# Patient Record
Sex: Male | Born: 1985 | Race: White | Hispanic: No | Marital: Single | Smoking: Former smoker
Health system: Southern US, Community
[De-identification: ages and names within clinical notes are randomized; demographics above are authoritative.]

## PROBLEM LIST (undated history)

## (undated) DIAGNOSIS — I10 Essential (primary) hypertension: Secondary | ICD-10-CM

## (undated) DIAGNOSIS — F319 Bipolar disorder, unspecified: Secondary | ICD-10-CM

---

## 2006-12-27 ENCOUNTER — Emergency Department (HOSPITAL_COMMUNITY): Admission: EM | Admit: 2006-12-27 | Discharge: 2006-12-28 | Payer: Self-pay | Admitting: Emergency Medicine

## 2016-06-10 ENCOUNTER — Emergency Department
Admission: EM | Admit: 2016-06-10 | Discharge: 2016-06-10 | Disposition: A | Discharge status: Home / Self Care | Payer: No Typology Code available for payment source | Attending: Emergency Medicine | Admitting: Emergency Medicine

## 2016-06-10 ENCOUNTER — Emergency Department: Payer: No Typology Code available for payment source

## 2016-06-10 ENCOUNTER — Encounter: Payer: Self-pay | Admitting: Emergency Medicine

## 2016-06-10 DIAGNOSIS — S301XXA Contusion of abdominal wall, initial encounter: Secondary | ICD-10-CM | POA: Insufficient documentation

## 2016-06-10 DIAGNOSIS — R51 Headache: Secondary | ICD-10-CM | POA: Insufficient documentation

## 2016-06-10 DIAGNOSIS — S80211A Abrasion, right knee, initial encounter: Secondary | ICD-10-CM | POA: Diagnosis not present

## 2016-06-10 DIAGNOSIS — Y939 Activity, unspecified: Secondary | ICD-10-CM | POA: Insufficient documentation

## 2016-06-10 DIAGNOSIS — T07XXXA Unspecified multiple injuries, initial encounter: Secondary | ICD-10-CM

## 2016-06-10 DIAGNOSIS — S199XXA Unspecified injury of neck, initial encounter: Secondary | ICD-10-CM | POA: Diagnosis present

## 2016-06-10 DIAGNOSIS — S161XXA Strain of muscle, fascia and tendon at neck level, initial encounter: Secondary | ICD-10-CM | POA: Diagnosis not present

## 2016-06-10 DIAGNOSIS — S80212A Abrasion, left knee, initial encounter: Secondary | ICD-10-CM | POA: Insufficient documentation

## 2016-06-10 DIAGNOSIS — Y999 Unspecified external cause status: Secondary | ICD-10-CM | POA: Insufficient documentation

## 2016-06-10 DIAGNOSIS — F17210 Nicotine dependence, cigarettes, uncomplicated: Secondary | ICD-10-CM | POA: Diagnosis not present

## 2016-06-10 DIAGNOSIS — Y92410 Unspecified street and highway as the place of occurrence of the external cause: Secondary | ICD-10-CM | POA: Insufficient documentation

## 2016-06-10 HISTORY — DX: Bipolar disorder, unspecified: F31.9

## 2016-06-10 LAB — URINALYSIS, COMPLETE (UACMP) WITH MICROSCOPIC
BILIRUBIN URINE: NEGATIVE
Bacteria, UA: NONE SEEN
GLUCOSE, UA: NEGATIVE mg/dL
HGB URINE DIPSTICK: NEGATIVE
KETONES UR: NEGATIVE mg/dL
LEUKOCYTES UA: NEGATIVE
Nitrite: NEGATIVE
PH: 7 (ref 5.0–8.0)
Protein, ur: 30 mg/dL — AB
Specific Gravity, Urine: 1.02 (ref 1.005–1.030)
Squamous Epithelial / LPF: NONE SEEN

## 2016-06-10 LAB — COMPREHENSIVE METABOLIC PANEL
ALBUMIN: 4.2 g/dL (ref 3.5–5.0)
ALK PHOS: 47 U/L (ref 38–126)
ALT: 31 U/L (ref 17–63)
AST: 21 U/L (ref 15–41)
Anion gap: 7 (ref 5–15)
BILIRUBIN TOTAL: 0.7 mg/dL (ref 0.3–1.2)
BUN: 23 mg/dL — AB (ref 6–20)
CO2: 25 mmol/L (ref 22–32)
Calcium: 9.1 mg/dL (ref 8.9–10.3)
Chloride: 105 mmol/L (ref 101–111)
Creatinine, Ser: 1.06 mg/dL (ref 0.61–1.24)
GFR calc Af Amer: >60 mL/min (ref >60)
GFR calc non Af Amer: >60 mL/min (ref >60)
GLUCOSE: 108 mg/dL — AB (ref 65–99)
POTASSIUM: 4 mmol/L (ref 3.5–5.1)
Sodium: 137 mmol/L (ref 135–145)
TOTAL PROTEIN: 7 g/dL (ref 6.5–8.1)

## 2016-06-10 LAB — LIPASE, BLOOD: Lipase: 31 U/L (ref 11–51)

## 2016-06-10 LAB — CBC WITH DIFFERENTIAL/PLATELET
BASOS ABS: 0.1 10*3/uL (ref 0–0.1)
BASOS PCT: 1 %
Eosinophils Absolute: 0 10*3/uL (ref 0–0.7)
Eosinophils Relative: 1 %
HEMATOCRIT: 48 % (ref 40.0–52.0)
HEMOGLOBIN: 16.1 g/dL (ref 13.0–18.0)
Lymphocytes Relative: 14 %
Lymphs Abs: 1.2 10*3/uL (ref 1.0–3.6)
MCH: 29.7 pg (ref 26.0–34.0)
MCHC: 33.5 g/dL (ref 32.0–36.0)
MCV: 88.5 fL (ref 80.0–100.0)
Monocytes Absolute: 0.7 10*3/uL (ref 0.2–1.0)
Monocytes Relative: 8 %
NEUTROS ABS: 6.6 10*3/uL — AB (ref 1.4–6.5)
NEUTROS PCT: 76 %
Platelets: 258 10*3/uL (ref 150–440)
RBC: 5.42 MIL/uL (ref 4.40–5.90)
RDW: 13.5 % (ref 11.5–14.5)
WBC: 8.5 10*3/uL (ref 3.8–10.6)

## 2016-06-10 MED ORDER — IOPAMIDOL (ISOVUE-300) INJECTION 61%
100.0000 mL | Freq: Once | INTRAVENOUS | Status: AC | PRN
  Administered 2016-06-10: 100 mL via INTRAVENOUS

## 2016-06-10 MED ORDER — SODIUM CHLORIDE 0.9 % IV BOLUS (SEPSIS)
1000.0000 mL | Freq: Once | INTRAVENOUS | Status: AC
  Administered 2016-06-10: 1000 mL via INTRAVENOUS

## 2016-06-10 NOTE — ED Notes (Signed)
C-collar in place. Pt lying flat on stretcher. Side rails up. Pt watching tv. Alert and oriented.
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
## 2016-06-10 NOTE — ED Notes (Signed)
Esignature pad not working in room. Pt verbalized understanding of DC papers and ambulated to lobby
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
## 2016-06-10 NOTE — ED Notes (Signed)
C-collar removed by Dr. Pershing ProudSchaevitz. Pt given urinal to attempt to collect urine sample.

## 2016-06-10 NOTE — ED Notes (Signed)
Highway patrol at bed side.  
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
## 2019-01-11 ENCOUNTER — Other Ambulatory Visit: Payer: Self-pay | Admitting: Surgery

## 2019-01-11 DIAGNOSIS — S46911A Strain of unspecified muscle, fascia and tendon at shoulder and upper arm level, right arm, initial encounter: Secondary | ICD-10-CM

## 2019-01-11 DIAGNOSIS — M7581 Other shoulder lesions, right shoulder: Secondary | ICD-10-CM | POA: Insufficient documentation

## 2019-03-09 ENCOUNTER — Other Ambulatory Visit: Payer: Self-pay | Admitting: Surgery

## 2019-03-09 DIAGNOSIS — M7581 Other shoulder lesions, right shoulder: Secondary | ICD-10-CM

## 2019-03-09 DIAGNOSIS — S46911D Strain of unspecified muscle, fascia and tendon at shoulder and upper arm level, right arm, subsequent encounter: Secondary | ICD-10-CM

## 2021-09-05 ENCOUNTER — Ambulatory Visit
Admission: RE | Admit: 2021-09-05 | Discharge: 2021-09-05 | Disposition: A | Discharge status: Home / Self Care | Payer: 59 | Source: Ambulatory Visit | Attending: Family Medicine | Admitting: Family Medicine

## 2021-09-05 ENCOUNTER — Ambulatory Visit (INDEPENDENT_AMBULATORY_CARE_PROVIDER_SITE_OTHER): Payer: 59

## 2021-09-05 VITALS — BP 146/99 | HR 115 | Temp 98.4°F | Resp 16

## 2021-09-05 DIAGNOSIS — M25562 Pain in left knee: Secondary | ICD-10-CM

## 2021-09-05 DIAGNOSIS — G8929 Other chronic pain: Secondary | ICD-10-CM

## 2021-09-05 MED ORDER — PREDNISONE 20 MG PO TABS: 40.0000 mg | ORAL_TABLET | Freq: Every day | ORAL | 0 refills | Status: DC
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
## 2021-09-05 NOTE — ED Provider Notes (Signed)
[facility]   [id number] 09/05/21 Arrival Time: 1109  ASSESSMENT & PLAN:  1. Chronic pain of left knee    I have personally viewed the imaging studies ordered this visit. No bony abnormalities on LEFT knee films today. Discussed.  Trial of: Discharge Medication List as of 09/05/2021 12:01 PM     START taking these medications   Details  predniSONE (DELTASONE) 20 MG tablet Take 2 tablets (40 mg total) by mouth daily., Starting Wed 09/05/2021, Normal       Work note provided; 72 hours.  Orders Placed This Encounter  Procedures   DG Knee Complete 4 Views Left    Recommend:  Follow-up Information     Schedule an appointment as soon as possible for a visit  with Ollen Gross, MD.   Specialty: Orthopedic Surgery Contact information: [address] [place] [place] [address] [phone]                Reviewed expectations re: course of current medical issues. Questions answered. Outlined signs and symptoms indicating need for more acute intervention. Patient verbalized understanding. After Visit Summary given.  SUBJECTIVE: History from: patient. Nathaniel Medina is a 36 y.o. male who reports fairly persistent mild to moderate pain of his left knee; thinks mostly anterior; described as dull; without radiation. First noted approx 4-5 mo ago. No specific trauma at that time. Worse after prolonged weight bearing. Associated symptoms: none reported. Extremity sensation changes or weakness: none. Occas ibuprofen without much help. No specific locking up or giving out of knee.  History reviewed. No pertinent surgical history.   OBJECTIVE:  Vitals:   09/05/21 1123  BP: (!) 146/99  Pulse: (!) 115  Resp: 16  Temp: 98.4 F (36.9 C)  TempSrc: Oral  SpO2: 95%    General appearance: alert; no distress HEENT: Staves; AT Neck: supple with FROM Resp: unlabored respirations Extremities: LLE: warm with well perfused appearance; no specific bony TTP  over left knee; without gross deformities; swelling: none; bruising: none; knee ROM: normal CV: brisk extremity capillary refill of LLE; 2+ DP pulse of LLE. Skin: warm and dry; no visible rashes Neurologic: gait normal; normal sensation and strength of LLE Psychological: alert and cooperative; normal mood and affect  Imaging: DG Knee Complete 4 Views Left  Result Date: 09/05/2021 CLINICAL DATA:  Chronic knee pain. EXAM: LEFT KNEE - COMPLETE 4+ VIEW COMPARISON:  None Available. FINDINGS: The joint spaces are maintained. No acute bony findings or osteochondral abnormality. No chondrocalcinosis or joint effusion. IMPRESSION: Normal left knee radiographs. Electronically Signed   By: Rudie Meyer M.D.   On: 09/05/2021 11:56      Allergies  Allergen Reactions   Penicillins Hives    Past Medical History:  Diagnosis Date   Manic depression (HCC)    Social History   Socioeconomic History   Marital status: Single    Spouse name: Not on file   Number of children: Not on file   Years of education: Not on file   Highest education level: Not on file  Occupational History   Not on file  Tobacco Use   Smoking status: Every Day    Types: Cigars   Smokeless tobacco: Never   Tobacco comments:    6 cigars/day  Substance and Sexual Activity   Alcohol use: Yes    Comment: occasionally   Drug use: Not on file   Sexual activity: Not on file  Other Topics Concern   Not on file  Social  History Narrative   Not on file   Social Determinants of Health   Financial Resource Strain: Not on file  Food Insecurity: Not on file  Transportation Needs: Not on file  Physical Activity: Not on file  Stress: Not on file  Social Connections: Not on file   History reviewed. No pertinent family history. History reviewed. No pertinent surgical history.     Mardella Layman, MD 09/05/21 1334

## 2021-10-07 ENCOUNTER — Encounter: Payer: Self-pay | Admitting: Emergency Medicine

## 2021-10-07 ENCOUNTER — Telehealth: Payer: Self-pay | Admitting: Emergency Medicine

## 2021-10-07 ENCOUNTER — Ambulatory Visit
Admission: EM | Admit: 2021-10-07 | Discharge: 2021-10-07 | Disposition: A | Discharge status: Home / Self Care | Payer: 59 | Attending: Physician Assistant | Admitting: Physician Assistant

## 2021-10-07 DIAGNOSIS — J40 Bronchitis, not specified as acute or chronic: Secondary | ICD-10-CM

## 2021-10-07 DIAGNOSIS — J329 Chronic sinusitis, unspecified: Secondary | ICD-10-CM

## 2021-10-07 DIAGNOSIS — R030 Elevated blood-pressure reading, without diagnosis of hypertension: Secondary | ICD-10-CM | POA: Diagnosis not present

## 2021-10-07 MED ORDER — DOXYCYCLINE HYCLATE 100 MG PO CAPS: 100.0000 mg | ORAL_CAPSULE | Freq: Two times a day (BID) | ORAL | 0 refills | Status: DC

## 2021-10-07 MED ORDER — ALBUTEROL SULFATE HFA 108 (90 BASE) MCG/ACT IN AERS: 1.0000 | INHALATION_SPRAY | Freq: Four times a day (QID) | RESPIRATORY_TRACT | 0 refills | Status: DC | PRN

## 2021-10-07 NOTE — ED Provider Notes (Signed)
[facility]    CSN: [id number] Arrival date & time: 10/07/21  1330      History   Chief Complaint Chief Complaint  Patient presents with   Cough    HPI Nathaniel Medina is a 36 y.o. male.   Patient presents today with a 2-week history of URI symptoms.  Reports cough, nasal congestion, sinus pressure, fatigue, malaise.  Denies fever, chest pain, shortness of breath, nausea, vomiting, diarrhea.  He has taken over-the-counter medications including Mucinex, Robitussin, tea without improvement of symptoms.  Denies any known sick contacts.  He does have seasonal allergies but does not take medication to manage this regularly.  Denies history of asthma, COPD, current smoking.  He is a former smoker but quit 2 years ago.  Denies history of diabetes or immunosuppression.  Denies any recent antibiotic use.  He was given steroids for musculoskeletal pain 09/05/2021 but has not had any additional steroids since that time.  Blood pressure is elevated has been elevated for the last several visits.  He reports taking antihypertensive medications in the past but has not taken them recently.  He is not currently seeing his PCP regularly but will schedule an appointment.  Does report he has been using decongestants.  Also reports he is drinking more caffeine as he works third shift.  Denies any current chest pain, shortness of breath, headache, vision change, dizziness.    Past Medical History:  Diagnosis Date   Manic depression (HCC)     There are no problems to display for this patient.   History reviewed. No pertinent surgical history.     Home Medications    Prior to Admission medications   Medication Sig Start Date End Date Taking? Authorizing Provider  albuterol (VENTOLIN HFA) 108 (90 Base) MCG/ACT inhaler Inhale 1-2 puffs into the lungs every 6 (six) hours as needed for wheezing or shortness of breath. 10/07/21  Yes Tennyson Wacha K, PA-C  doxycycline (VIBRAMYCIN) 100 MG capsule  Take 1 capsule (100 mg total) by mouth 2 (two) times daily. 10/07/21  Yes Earlyne Feeser, Noberto Retort, PA-C    Family History History reviewed. No pertinent family history.  Social History Social History   Tobacco Use   Smoking status: Former    Types: Cigars   Smokeless tobacco: Never   Tobacco comments:    6 cigars/day  Vaping Use   Vaping Use: Never used  Substance Use Topics   Alcohol use: Yes    Comment: occasionally   Drug use: Never     Allergies   Penicillins   Review of Systems Review of Systems  Constitutional:  Positive for activity change. Negative for appetite change, fatigue and fever.  HENT:  Positive for congestion, sinus pressure and sore throat. Negative for sneezing.   Eyes:  Negative for visual disturbance.  Respiratory:  Positive for cough. Negative for shortness of breath.   Cardiovascular:  Negative for chest pain.  Gastrointestinal:  Negative for abdominal pain, diarrhea, nausea and vomiting.  Neurological:  Negative for dizziness, light-headedness and headaches.     Physical Exam Triage Vital Signs ED Triage Vitals  Enc Vitals Group     BP 10/07/21 1443 (!) 163/116     Pulse Rate 10/07/21 1443 83     Resp 10/07/21 1443 18     Temp 10/07/21 1443 98.9 F (37.2 C)     Temp Source 10/07/21 1443 Oral     SpO2 10/07/21 1443 93 %     Weight 10/07/21 1445 185  lb (83.9 kg)     Height 10/07/21 1445 5\' 11"  (1.803 m)     Head Circumference --      Peak Flow --      Pain Score 10/07/21 1445 0     Pain Loc --      Pain Edu? --      Excl. in [place]? --    No data found.  Updated Vital Signs BP (!) 163/116 (BP Location: Right Arm)   Pulse 83   Temp 98.9 F (37.2 C) (Oral)   Resp 18   Ht 5\' 11"  (1.803 m)   Wt 185 lb (83.9 kg)   SpO2 93%   BMI 25.80 kg/m   Visual Acuity Right Eye Distance:   Left Eye Distance:   Bilateral Distance:    Right Eye Near:   Left Eye Near:    Bilateral Near:     Physical Exam Vitals reviewed.  Constitutional:       General: He is awake.     Appearance: Normal appearance. He is well-developed. He is not ill-appearing.     Comments: Very pleasant male appears stated age in no acute distress sitting comfortably in exam room  HENT:     Head: Normocephalic and atraumatic.     Right Ear: Tympanic membrane, ear canal and external ear normal. Tympanic membrane is not erythematous or bulging.     Left Ear: Tympanic membrane, ear canal and external ear normal. Tympanic membrane is not erythematous or bulging.     Nose: Nose normal.     Mouth/Throat:     Pharynx: Uvula midline. No oropharyngeal exudate or posterior oropharyngeal erythema.  Cardiovascular:     Rate and Rhythm: Normal rate and regular rhythm.     Heart sounds: Normal heart sounds, S1 normal and S2 normal. No murmur heard. Pulmonary:     Effort: Pulmonary effort is normal. No accessory muscle usage or respiratory distress.     Breath sounds: Normal breath sounds. No stridor. No wheezing, rhonchi or rales.     Comments: Clear to auscultation bilaterally Neurological:     Mental Status: He is alert.  Psychiatric:        Behavior: Behavior is cooperative.      UC Treatments / Results  Labs (all labs ordered are listed, but only abnormal results are displayed) Labs Reviewed - No data to display  EKG   Radiology No results found.  Procedures Procedures (including critical care time)  Medications Ordered in UC Medications - No data to display  Initial Impression / Assessment and Plan / UC Course  I have reviewed the triage vital signs and the nursing notes.  Pertinent labs & imaging results that were available during my care of the patient were reviewed by me and considered in my medical decision making (see chart for details).     Patient is well-appearing, afebrile, nontoxic, nontachycardic.  No indication for viral testing as patient has been symptomatic for several weeks and this would not change management.  Given prolonged  and worsening symptoms concern for secondary bacterial infection.  We will treat with doxycycline given history of penicillin allergy.  He was instructed to avoid prolonged sun exposure with this medication.  Can use over-the-counter medications including Mucinex, Flonase, Tylenol.  He was given albuterol inhaler for shortness of breath and coughing fits.  Recommended he rest and drink plenty of fluid.  If his symptoms or not improving within a week he is to return for reevaluation.  If  anything worsens he needs to return immediately.  Blood pressure is very elevated.  Patient declined reinitiating antihypertensive medication today.  Denies any signs/symptoms of endorgan damage.  Recommended he avoid NSAIDs, caffeine, sodium, decongestants.  He is to follow-up closely with his PCP.  Discussed that if he develops any chest pain, shortness of breath, headache, vision change, dizziness in the setting of high blood pressure he is to go to the emergency room to which he expressed understanding.  Final Clinical Impressions(s) / UC Diagnoses   Final diagnoses:  Sinobronchitis  Elevated blood pressure reading     Discharge Instructions      I was that you have bronchitis.  Start doxycycline 100 mg twice daily for 10 days.  Use albuterol inhaler every 4-6 hours as needed for shortness of breath.  Continue your over-the-counter medications including Mucinex, Tylenol, Flonase.  Make sure you rest and drink plenty of fluid.  If your symptoms are not improving return for reevaluation.  Your blood pressure is very elevated.  Please follow-up with your primary care.  Avoid decongestants, caffeine, sodium, NSAIDs (aspirin, ibuprofen/Advil, naproxen/Aleve).  Monitor your blood pressure at home.  If you develop any chest pain, shortness of breath, headache, vision change, dizziness in the setting of high blood pressure you need to go to the emergency room.     ED Prescriptions     Medication Sig Dispense Auth.  Provider   doxycycline (VIBRAMYCIN) 100 MG capsule Take 1 capsule (100 mg total) by mouth 2 (two) times daily. 20 capsule Jasline Buskirk K, PA-C   albuterol (VENTOLIN HFA) 108 (90 Base) MCG/ACT inhaler Inhale 1-2 puffs into the lungs every 6 (six) hours as needed for wheezing or shortness of breath. 18 g Aailyah Dunbar K, PA-C      PDMP not reviewed this encounter.   Jeani Hawking, PA-C 10/07/21 1509

## 2021-10-07 NOTE — Discharge Instructions (Signed)
I was that you have bronchitis.  Start doxycycline 100 mg twice daily for 10 days.  Use albuterol inhaler every 4-6 hours as needed for shortness of breath.  Continue your over-the-counter medications including Mucinex, Tylenol, Flonase.  Make sure you rest and drink plenty of fluid.  If your symptoms are not improving return for reevaluation.  Your blood pressure is very elevated.  Please follow-up with your primary care.  Avoid decongestants, caffeine, sodium, NSAIDs (aspirin, ibuprofen/Advil, naproxen/Aleve).  Monitor your blood pressure at home.  If you develop any chest pain, shortness of breath, headache, vision change, dizziness in the setting of high blood pressure you need to go to the emergency room.

## 2021-10-07 NOTE — Telephone Encounter (Signed)
Pt called and asked if prescriptions could be sent to [facility] in [place]. Prescriptions electronically sent to [facility]. Pt aware.

## 2021-10-07 NOTE — ED Triage Notes (Signed)
Patient c/o productive cough x 2 weeks, congestion, nasal drainage.  Patient has been taken Mucinex, Robitussin, Tumeric tea.

## 2021-10-08 ENCOUNTER — Ambulatory Visit: Payer: Self-pay

## 2021-10-17 ENCOUNTER — Ambulatory Visit
Admission: RE | Admit: 2021-10-17 | Discharge: 2021-10-17 | Disposition: A | Discharge status: Home / Self Care | Payer: 59 | Source: Ambulatory Visit | Attending: Nurse Practitioner | Admitting: Nurse Practitioner

## 2021-10-17 ENCOUNTER — Other Ambulatory Visit: Payer: Self-pay

## 2021-10-17 ENCOUNTER — Ambulatory Visit (INDEPENDENT_AMBULATORY_CARE_PROVIDER_SITE_OTHER): Payer: 59

## 2021-10-17 VITALS — BP 144/101 | HR 96 | Temp 97.9°F | Resp 20

## 2021-10-17 DIAGNOSIS — R109 Unspecified abdominal pain: Secondary | ICD-10-CM

## 2021-10-17 DIAGNOSIS — R1033 Periumbilical pain: Secondary | ICD-10-CM | POA: Insufficient documentation

## 2021-10-17 HISTORY — DX: Essential (primary) hypertension: I10

## 2021-10-17 LAB — POCT URINALYSIS DIP (MANUAL ENTRY)
Bilirubin, UA: NEGATIVE
Blood, UA: NEGATIVE
Glucose, UA: NEGATIVE mg/dL
Ketones, POC UA: NEGATIVE mg/dL
Leukocytes, UA: NEGATIVE
Nitrite, UA: NEGATIVE
Protein Ur, POC: NEGATIVE mg/dL
Spec Grav, UA: 1.02 (ref 1.010–1.025)
Urobilinogen, UA: 0.2 E.U./dL
pH, UA: 7 (ref 5.0–8.0)

## 2021-10-17 NOTE — ED Provider Notes (Addendum)
[facility]    CSN: [id number] Arrival date & time: 10/17/21  1109      History   Chief Complaint Chief Complaint  Patient presents with   Abdominal Pain    HPI Nathaniel Medina is a 36 y.o. male.   The history is provided by the patient.   Patient presents for a several day history of lower abdominal pain.  Patient states that previously he had been diagnosed with bronchitis and had taken some time off from exercising.  He states that when he started exercising again he noticed subsequently thereafter he developed pain in his lower pelvic region.  Patient states that the pain feels more "sore" but he does have intermittent sharp pain.  He notices the pain more when he is up moving around versus when he is at rest.  He states that he has not had any fever, chills, nausea, vomiting, diarrhea, or abdominal tenderness.  He states that the pain worsens when he bends, twists, or turns.  States that he has tried Tylenol and ibuprofen for his symptoms. Past Medical History:  Diagnosis Date   Hypertension    Manic depression (HCC)     There are no problems to display for this patient.   History reviewed. No pertinent surgical history.     Home Medications    Prior to Admission medications   Medication Sig Start Date End Date Taking? Authorizing Provider  albuterol (VENTOLIN HFA) 108 (90 Base) MCG/ACT inhaler Inhale 1-2 puffs into the lungs every 6 (six) hours as needed for wheezing or shortness of breath. 10/07/21  Yes Raspet, Erin K, PA-C  doxycycline (VIBRAMYCIN) 100 MG capsule Take 1 capsule (100 mg total) by mouth 2 (two) times daily. 10/07/21  Yes Raspet, Noberto Retort, PA-C    Family History History reviewed. No pertinent family history.  Social History Social History   Tobacco Use   Smoking status: Former    Types: Cigars   Smokeless tobacco: Never   Tobacco comments:    6 cigars/day  Vaping Use   Vaping Use: Never used  Substance Use Topics   Alcohol use: Yes     Comment: occasionally   Drug use: Never     Allergies   Penicillins   Review of Systems Review of Systems Per HPI  Physical Exam Triage Vital Signs ED Triage Vitals [10/17/21 1121]  Enc Vitals Group     BP (!) 153/112     Pulse Rate 96     Resp 20     Temp 97.9 F (36.6 C)     Temp Source Oral     SpO2 96 %     Weight      Height      Head Circumference      Peak Flow      Pain Score 5     Pain Loc      Pain Edu?      Excl. in [place]?    No data found.  Updated Vital Signs BP (!) 144/101 (BP Location: Right Arm)   Pulse 96   Temp 97.9 F (36.6 C) (Oral)   Resp 20   SpO2 96%   Visual Acuity Right Eye Distance:   Left Eye Distance:   Bilateral Distance:    Right Eye Near:   Left Eye Near:    Bilateral Near:     Physical Exam Vitals and nursing note reviewed.  Constitutional:      General: He is not in acute distress.  Appearance: He is well-developed.  HENT:     Head: Normocephalic.  Cardiovascular:     Rate and Rhythm: Regular rhythm.     Pulses: Normal pulses.     Heart sounds: Normal heart sounds.  Pulmonary:     Effort: Pulmonary effort is normal.     Breath sounds: Normal breath sounds.  Abdominal:     General: Abdomen is protuberant. Bowel sounds are normal.     Palpations: Abdomen is soft.     Tenderness: There is no abdominal tenderness. There is no right CVA tenderness, left CVA tenderness, guarding or rebound.  Musculoskeletal:     Right lower leg: No edema.     Left lower leg: No edema.  Skin:    General: Skin is warm and dry.  Neurological:     General: No focal deficit present.     Mental Status: He is alert.  Psychiatric:        Mood and Affect: Mood normal.        Behavior: Behavior normal.      UC Treatments / Results  Labs (all labs ordered are listed, but only abnormal results are displayed) Labs Reviewed  POCT URINALYSIS DIP (MANUAL ENTRY)  CYTOLOGY, (ORAL, ANAL, URETHRAL) ANCILLARY ONLY     EKG   Radiology DG Abd 1 View  Result Date: 10/17/2021 CLINICAL DATA:  Abdominal pain EXAM: ABDOMEN - 1 VIEW COMPARISON:  None Available. FINDINGS: The bowel gas pattern is normal. No radio-opaque calculi or other significant radiographic abnormality are seen. Normal mild stool in the colon. IMPRESSION: Negative. Electronically Signed   By: Marlan Palau M.D.   On: 10/17/2021 11:56    Procedures Procedures (including critical care time)  Medications Ordered in UC Medications - No data to display  Initial Impression / Assessment and Plan / UC Course  I have reviewed the triage vital signs and the nursing notes.  Pertinent labs & imaging results that were available during my care of the patient were reviewed by me and considered in my medical decision making (see chart for details).  Patient presents for complaints of periumbilical abdominal pain.  On exam, patient is in no acute distress, his blood pressure is elevated, which appears to be his baseline compared to his blood pressure on 10/07/2021.  Patient does not display any chest pain, shortness of breath, difficulty breathing, or lower extremity edema.  With regard to his abdominal pain.  Patient's urinalysis and x-rays were negative for any abnormal findings.  Discussed with patient that we will contact him if his cytology test results are positive.  Differential diagnoses include sexually transmitted infection, kidney stone, and abdominal wall muscle strain.  Supportive care recommendations were discussed with the patient along with strict indications of when to follow-up in the emergency department.  Patient was given a work note.  Patient verbalizes understanding.  All questions were answered.  Blood pressure was rechecked at time of discharge (144/100).  Patient advised to follow-up with a primary care physician for his blood pressure. Final Clinical Impressions(s) / UC Diagnoses   Final diagnoses:  Periumbilical abdominal pain      Discharge Instructions      Your urinalysis and x-rays did not show any abnormalities.  Your cytology test is pending.  You will be contacted if the results are positive to discuss treatment. Recommend Tylenol Arthritis Strength, may take 1 tablet every 8 hours as needed for abdominal pain. Warm compresses to the abdomen as needed. Gentle stretching exercises to help  with the pain in your lower abdomen. As discussed, if you develop fever, chills, worsening abdominal pain with nausea, vomiting, or other concerns, please go to the emergency department immediately. Follow-up as needed.     ED Prescriptions   None    PDMP not reviewed this encounter.   Tish Men, NP 10/17/21 1242    Kemara Quigley-Warren, Alda Lea, NP 10/17/21 1244

## 2021-10-17 NOTE — ED Triage Notes (Signed)
Pt reports lower abdominal/pelvic pain since Tuesday night. LBM this am. Pt denies dysuria, fever, n/v/d. Pt reports started working out again and reports has started lifting weights.

## 2021-10-17 NOTE — ED Notes (Addendum)
Pt unable to void at this time. Ice water provided  Pt also reports does not currently have pcp. Tablet provided and pt scheduling appt with available practice.

## 2021-10-17 NOTE — Discharge Instructions (Addendum)
Your urinalysis and x-rays did not show any abnormalities.  Your cytology test is pending.  You will be contacted if the results are positive to discuss treatment. Recommend Tylenol Arthritis Strength, may take 1 tablet every 8 hours as needed for abdominal pain. Warm compresses to the abdomen as needed. Gentle stretching exercises to help with the pain in your lower abdomen. As discussed, if you develop fever, chills, worsening abdominal pain with nausea, vomiting, or other concerns, please go to the emergency department immediately. Follow-up as needed.

## 2021-10-18 LAB — CYTOLOGY, (ORAL, ANAL, URETHRAL) ANCILLARY ONLY
Chlamydia: NEGATIVE
Comment: NEGATIVE
Comment: NEGATIVE
Comment: NORMAL
Neisseria Gonorrhea: NEGATIVE
Trichomonas: NEGATIVE

## 2021-10-19 ENCOUNTER — Other Ambulatory Visit: Payer: Self-pay

## 2021-10-19 ENCOUNTER — Ambulatory Visit (INDEPENDENT_AMBULATORY_CARE_PROVIDER_SITE_OTHER): Payer: 59 | Admitting: Nurse Practitioner

## 2021-10-19 ENCOUNTER — Encounter: Payer: Self-pay | Admitting: Nurse Practitioner

## 2021-10-19 VITALS — BP 150/98 | HR 79 | Ht 71.0 in | Wt 189.0 lb

## 2021-10-19 DIAGNOSIS — F32A Depression, unspecified: Secondary | ICD-10-CM

## 2021-10-19 DIAGNOSIS — F419 Anxiety disorder, unspecified: Secondary | ICD-10-CM | POA: Diagnosis not present

## 2021-10-19 DIAGNOSIS — Z2821 Immunization not carried out because of patient refusal: Secondary | ICD-10-CM

## 2021-10-19 DIAGNOSIS — J40 Bronchitis, not specified as acute or chronic: Secondary | ICD-10-CM

## 2021-10-19 DIAGNOSIS — J329 Chronic sinusitis, unspecified: Secondary | ICD-10-CM

## 2021-10-19 DIAGNOSIS — I10 Essential (primary) hypertension: Secondary | ICD-10-CM | POA: Insufficient documentation

## 2021-10-19 MED ORDER — ESCITALOPRAM OXALATE 10 MG PO TABS: 10.0000 mg | ORAL_TABLET | Freq: Every day | ORAL | 0 refills | Status: DC

## 2021-10-19 MED ORDER — UNABLE TO FIND: 0 refills | Status: DC

## 2021-10-19 MED ORDER — AMLODIPINE BESYLATE 2.5 MG PO TABS: 2.5000 mg | ORAL_TABLET | Freq: Every day | ORAL | 0 refills | Status: DC

## 2021-10-19 MED ORDER — UNABLE TO FIND: 0 refills | Status: AC

## 2021-10-19 NOTE — Addendum Note (Signed)
Addended by: Jill Side on: 10/19/2021 01:15 PM   Modules accepted: Orders

## 2021-10-19 NOTE — Progress Notes (Signed)
Established Patient Office Visit  Subjective:  Patient ID: Nathaniel Medina, male    DOB: 1985/09/04  Age: 36 y.o. MRN: [id number]  CC:  Chief Complaint  Patient presents with   New Patient (Initial Visit)    np    HPI Nathaniel Medina is a 36 y.o. male with past medical history of hypertension, ADHD, who presents to establish care for his chronic medical conditions .  Previous PCP is at [facility] family [facility].  Last visit was over 2 years ago.  Hypertension.  Currently not on medication, states that he was on medications in the past but he decided to stop taking medications. He sometimes has HA, denies sinus, chest pain, syncope, edema   Anxiety and depression.  Feels that his depression is due to his financial situation, been in debt and working night shift.  He has been seeing  a mental health specialist via tele he was told he has ADHD.  Currently not on medication for ADHD.  [facility] at that time  he was taking medications daily but they made him drowsy.  He denies SI, HI  Bronchitis. States that his cough is much better , taking doxycycline 100 mg twice daily.  He denies wheezing shortness of breath  Due for Flu vaccine need for flu vaccine discussed with patient. States that he might have had TDAP vaccine within the last 10 years . Records requested from previous PCP today         Past Medical History:  Diagnosis Date   Hypertension    Manic depression ([place])     History reviewed. No pertinent surgical history.  Family History  Problem Relation Age of Onset   Breast cancer Mother     Social History   Socioeconomic History   Marital status: Single    Spouse name: Not on file   Number of children: 2   Years of education: Not on file   Highest education level: Not on file  Occupational History   Not on file  Tobacco Use   Smoking status: Former    Types: Cigars    Quit date: 2020    Years since quitting: 3.7   Smokeless  tobacco: Never   Tobacco comments:    6 cigars/day  Vaping Use   Vaping Use: Never used  Substance and Sexual Activity   Alcohol use: Not Currently    Comment: occasionally   Drug use: Never   Sexual activity: Yes  Other Topics Concern   Not on file  Social History Narrative   Live with his parents .    Social Determinants of Health   Financial Resource Strain: Not on file  Food Insecurity: Not on file  Transportation Needs: Not on file  Physical Activity: Not on file  Stress: Not on file  Social Connections: Not on file  Intimate Partner Violence: Not on file    Outpatient Medications Prior to Visit  Medication Sig Dispense Refill   albuterol (VENTOLIN HFA) 108 (90 Base) MCG/ACT inhaler Inhale 1-2 puffs into the lungs every 6 (six) hours as needed for wheezing or shortness of breath. 18 g 0   Ascorbic Acid (VITAMIN C PO) Take by mouth. As needed     ASHWAGANDHA PO Take by mouth. Once daily     doxycycline (VIBRAMYCIN) 100 MG capsule Take 1 capsule (100 mg total) by mouth 2 (two) times daily. 20 capsule 0   Ferrous Sulfate (IRON PO) Take by  mouth. Once daily     MAGNESIUM OXIDE PO Take by mouth. Once daily     Multiple Vitamins-Minerals (ZINC PO) Take by mouth. Once daily     TURMERIC PO Take by mouth. Once daily     No facility-administered medications prior to visit.    Allergies  Allergen Reactions   Penicillins Hives    ROS Review of Systems  Constitutional: Negative.  Negative for activity change, appetite change and chills.  HENT: Negative.  Negative for sinus pressure, sore throat, trouble swallowing and voice change.   Respiratory: Negative.  Negative for apnea, choking, chest tightness, shortness of breath and wheezing.   Cardiovascular: Negative.  Negative for chest pain, palpitations and leg swelling.  Gastrointestinal: Negative.  Negative for abdominal pain.  Musculoskeletal: Negative.  Negative for arthralgias, back pain and gait problem.   Neurological:  Positive for headaches. Negative for dizziness, seizures, facial asymmetry, light-headedness and numbness.  Psychiatric/Behavioral:  Negative for agitation, behavioral problems, confusion, decreased concentration and suicidal ideas. The patient is nervous/anxious.       Objective:    Physical Exam Constitutional:      General: He is not in acute distress.    Appearance: Normal appearance. He is not ill-appearing, toxic-appearing or diaphoretic.  HENT:     Right Ear: External ear normal.     Left Ear: External ear normal.     Nose: Nose normal. No congestion or rhinorrhea.     Mouth/Throat:     Mouth: Mucous membranes are moist.     Pharynx: Oropharynx is clear. No oropharyngeal exudate or posterior oropharyngeal erythema.  Eyes:     General: No scleral icterus.       Right eye: No discharge.        Left eye: No discharge.     Extraocular Movements: Extraocular movements intact.     Conjunctiva/sclera: Conjunctivae normal.     Pupils: Pupils are equal, round, and reactive to light.  Cardiovascular:     Rate and Rhythm: Normal rate and regular rhythm.     Pulses: Normal pulses.     Heart sounds: Normal heart sounds. No murmur heard.    No friction rub. No gallop.  Pulmonary:     Effort: Pulmonary effort is normal. No respiratory distress.     Breath sounds: Normal breath sounds. No stridor. No wheezing, rhonchi or rales.  Chest:     Chest wall: No tenderness.  Abdominal:     Palpations: Abdomen is soft.     Tenderness: There is no abdominal tenderness. There is no guarding.  Musculoskeletal:        General: No swelling, tenderness, deformity or signs of injury. Normal range of motion.     Right lower leg: No edema.     Left lower leg: No edema.  Skin:    General: Skin is warm and dry.     Capillary Refill: Capillary refill takes less than 2 seconds.     Coloration: Skin is not jaundiced or pale.     Findings: No bruising, erythema, lesion or rash.   Neurological:     Mental Status: He is alert and oriented to person, place, and time.     Cranial Nerves: No cranial nerve deficit.     Sensory: No sensory deficit.     Motor: No weakness.     Coordination: Coordination normal.     Gait: Gait normal.  Psychiatric:        Mood and Affect: Mood normal.  Behavior: Behavior normal.        Thought Content: Thought content normal.        Judgment: Judgment normal.     BP (!) 150/98   Pulse 79   Ht $R'5\' 11"'dh$  (1.803 m)   Wt 189 lb (85.7 kg)   SpO2 95%   BMI 26.36 kg/m  Wt Readings from Last 3 Encounters:  10/19/21 189 lb (85.7 kg)  10/07/21 185 lb (83.9 kg)  06/10/16 170 lb (77.1 kg)    No results found for: "TSH" Lab Results  Component Value Date   WBC 8.5 06/10/2016   HGB 16.1 06/10/2016   HCT 48.0 06/10/2016   MCV 88.5 06/10/2016   PLT 258 06/10/2016   Lab Results  Component Value Date   NA 137 06/10/2016   K 4.0 06/10/2016   CO2 25 06/10/2016   GLUCOSE 108 (H) 06/10/2016   BUN 23 (H) 06/10/2016   CREATININE 1.06 06/10/2016   BILITOT 0.7 06/10/2016   ALKPHOS 47 06/10/2016   AST 21 06/10/2016   ALT 31 06/10/2016   PROT 7.0 06/10/2016   ALBUMIN 4.2 06/10/2016   CALCIUM 9.1 06/10/2016   ANIONGAP 7 06/10/2016   No results found for: "CHOL" No results found for: "HDL" No results found for: "LDLCALC" No results found for: "TRIG" No results found for: "CHOLHDL" No results found for: "HGBA1C"    Assessment & Plan:   Problem List Items Addressed This Visit       Cardiovascular and Mediastinum   High blood pressure    BP Readings from Last 3 Encounters:  10/19/21 (!) 150/98  10/17/21 (!) 144/101  10/07/21 (!) 163/116  Chronic uncontrolled condition He was on medication in the past but he decided to stop taking the medication. Start amlodipine 2.5 mg daily blood pressure goal is less than 140/90. DASH diet advised patient encouraged to engage in regular moderate exercises at least 150 minutes  weekly CMP today Follow-up in 6 weeks      Relevant Medications   amLODipine (NORVASC) 2.5 MG tablet   Other Relevant Orders   CMP14+EGFR     Respiratory   Sinobronchitis    Cough is improving Continue doxycycline 100 mg twice daily.         Other   Refused influenza vaccine - Primary   Anxiety and depression    PHQ-9 score 17, GAD-7 score 14 Chronic uncontrolled condition Start Lexapro 10 mg daily Patient referred to [facility] for counseling and referral to psych ED denies SI, HI Follow-up in 6 weeks      Relevant Medications   escitalopram (LEXAPRO) 10 MG tablet   Other Relevant Orders   AMB Referral to [place] ordered this encounter  Medications   amLODipine (NORVASC) 2.5 MG tablet    Sig: Take 1 tablet (2.5 mg total) by mouth daily.    Dispense:  60 tablet    Refill:  0   escitalopram (LEXAPRO) 10 MG tablet    Sig: Take 1 tablet (10 mg total) by mouth daily.    Dispense:  60 tablet    Refill:  0    Follow-up: Return in about 6 weeks (around 11/30/2021) for CPE /follw up for BP , anxiety and depression .    Renee Rival, FNP

## 2021-10-19 NOTE — Assessment & Plan Note (Addendum)
PHQ-9 score 17, GAD-7 score 14 Chronic uncontrolled condition Start Lexapro 10 mg daily Patient referred to [facility] for counseling and referral to psych ED denies SI, HI Follow-up in 6 weeks

## 2021-10-19 NOTE — Assessment & Plan Note (Signed)
BP Readings from Last 3 Encounters:  10/19/21 (!) 150/98  10/17/21 (!) 144/101  10/07/21 (!) 163/116  Chronic uncontrolled condition He was on medication in the past but he decided to stop taking the medication. Start amlodipine 2.5 mg daily blood pressure goal is less than 140/90. DASH diet advised patient encouraged to engage in regular moderate exercises at least 150 minutes weekly CMP today Follow-up in 6 weeks

## 2021-10-19 NOTE — Patient Instructions (Addendum)
Please start taking amlodipine 2.5mg  daily  for your high blood pressure   Please take lexapro 10 mg daily for your anxiety and depression   Nurse please obtain records from previous PCP  Around 3 times per week, check your blood pressure 2 times per day. once in the morning and once in the evening. The readings should be at least one minute apart. Write down these values and bring them to your next nurse visit/appointment.  When you check your BP, make sure you have been doing something calm/relaxing 5 minutes prior to checking. Both feet should be flat on the floor and you should be sitting. Use your left arm and make sure it is in a relaxed position (on a table), and that the cuff is at the approximate level/height of your heart.

## 2021-10-19 NOTE — Addendum Note (Signed)
Addended by: Renee Rival on: 10/19/2021 01:48 PM   Modules accepted: Orders

## 2021-10-19 NOTE — Assessment & Plan Note (Signed)
Cough is improving Continue doxycycline 100 mg twice daily.

## 2021-10-20 LAB — CMP14+EGFR
ALT: 45 IU/L — ABNORMAL HIGH (ref 0–44)
AST: 28 IU/L (ref 0–40)
Albumin/Globulin Ratio: 1.7 (ref 1.2–2.2)
Albumin: 4.8 g/dL (ref 4.1–5.1)
Alkaline Phosphatase: 72 IU/L (ref 44–121)
BUN/Creatinine Ratio: 13 (ref 9–20)
BUN: 12 mg/dL (ref 6–20)
Bilirubin Total: <0.2 mg/dL (ref 0.0–1.2)
CO2: 22 mmol/L (ref 20–29)
Calcium: 10.4 mg/dL — ABNORMAL HIGH (ref 8.7–10.2)
Chloride: 101 mmol/L (ref 96–106)
Creatinine, Ser: 0.94 mg/dL (ref 0.76–1.27)
Globulin, Total: 2.8 g/dL (ref 1.5–4.5)
Glucose: 99 mg/dL (ref 70–99)
Potassium: 5.4 mmol/L — ABNORMAL HIGH (ref 3.5–5.2)
Sodium: 140 mmol/L (ref 134–144)
Total Protein: 7.6 g/dL (ref 6.0–8.5)
eGFR: 107 mL/min/{1.73_m2} (ref >59)

## 2021-10-22 NOTE — Progress Notes (Signed)
Potasium level is elevated, calcium level is also elevated, avoid supplements containing potassium and calcium   Liver enzymes is slightly elevated, avoid alcohol,  Follow up as planned.

## 2021-10-25 ENCOUNTER — Telehealth: Payer: Self-pay | Admitting: Internal Medicine

## 2021-10-25 NOTE — Telephone Encounter (Signed)
Patient called in regard to ADHD meds   Patient states that pharm has not received.  Needs med filled, wants a cll back when med is sent in

## 2021-10-26 ENCOUNTER — Telehealth: Payer: Self-pay | Admitting: Internal Medicine

## 2021-10-26 NOTE — Telephone Encounter (Signed)
Spoke with pt

## 2021-10-26 NOTE — Telephone Encounter (Signed)
No check with front

## 2021-10-26 NOTE — Telephone Encounter (Signed)
Pt wants to know if you have received his medical records. Please call pt

## 2021-11-10 ENCOUNTER — Other Ambulatory Visit: Payer: Self-pay | Admitting: Nurse Practitioner

## 2021-11-10 DIAGNOSIS — I10 Essential (primary) hypertension: Secondary | ICD-10-CM

## 2021-11-10 DIAGNOSIS — F419 Anxiety disorder, unspecified: Secondary | ICD-10-CM

## 2021-11-14 ENCOUNTER — Other Ambulatory Visit: Payer: Self-pay

## 2021-11-14 DIAGNOSIS — I10 Essential (primary) hypertension: Secondary | ICD-10-CM

## 2021-11-14 DIAGNOSIS — F419 Anxiety disorder, unspecified: Secondary | ICD-10-CM

## 2021-11-14 MED ORDER — ESCITALOPRAM OXALATE 10 MG PO TABS: 10.0000 mg | ORAL_TABLET | Freq: Every day | ORAL | 1 refills | Status: DC

## 2021-11-14 MED ORDER — AMLODIPINE BESYLATE 2.5 MG PO TABS: 2.5000 mg | ORAL_TABLET | Freq: Every day | ORAL | 1 refills | Status: DC

## 2021-11-22 ENCOUNTER — Encounter: Payer: Self-pay | Admitting: Internal Medicine

## 2021-11-22 ENCOUNTER — Ambulatory Visit (INDEPENDENT_AMBULATORY_CARE_PROVIDER_SITE_OTHER): Payer: 59 | Admitting: Internal Medicine

## 2021-11-22 VITALS — BP 151/97 | HR 86 | Ht 71.0 in | Wt 187.6 lb

## 2021-11-22 DIAGNOSIS — I10 Essential (primary) hypertension: Secondary | ICD-10-CM | POA: Diagnosis not present

## 2021-11-22 DIAGNOSIS — F909 Attention-deficit hyperactivity disorder, unspecified type: Secondary | ICD-10-CM | POA: Diagnosis not present

## 2021-11-22 DIAGNOSIS — F419 Anxiety disorder, unspecified: Secondary | ICD-10-CM | POA: Diagnosis not present

## 2021-11-22 DIAGNOSIS — Z0001 Encounter for general adult medical examination with abnormal findings: Secondary | ICD-10-CM

## 2021-11-22 DIAGNOSIS — F32A Depression, unspecified: Secondary | ICD-10-CM

## 2021-11-22 MED ORDER — ESCITALOPRAM OXALATE 20 MG PO TABS: 20.0000 mg | ORAL_TABLET | Freq: Every day | ORAL | 2 refills | Status: DC

## 2021-11-22 MED ORDER — AMLODIPINE-OLMESARTAN 5-20 MG PO TABS: 1.0000 | ORAL_TABLET | Freq: Every day | ORAL | 2 refills | Status: DC

## 2021-11-22 NOTE — Progress Notes (Signed)
Complete physical exam  Patient: Nathaniel Medina   DOB: 1985-06-11   36 y.o. Male  MRN: [id number]  Subjective:    Chief Complaint  Patient presents with   Annual Exam    Nathaniel Medina is a 36 y.o. male who presents today for a complete physical exam. He reports consuming a general diet. The patient does not participate in regular exercise at present. He generally feels fairly well. He reports sleeping well. He does have additional problems to discuss today.   [name] states that he feels "mellow" today.  He has been taking Lexapro as prescribed and has establish care with [facility] for management of ADHD.  He will soon be starting a stimulant but first needs his blood pressure under better control.  He continues to describe difficulty with concentration and focus.  He is unsure if the symptoms are related to depression anxiety or are more attributable to untreated ADHD.  Most recent fall risk assessment:    11/22/2021    1:40 PM  [place] in the past year? 0  Number falls in past yr: 0  Injury with Fall? 0  Risk for fall due to : No Fall Risks  Follow up Falls evaluation completed     Most recent depression screenings:    11/22/2021    1:41 PM 10/19/2021   10:39 AM  PHQ 2/9 Scores  PHQ - 2 Score 1 2  PHQ- 9 Score  17   Past Medical History:  Diagnosis Date   Hypertension    Manic depression ([place])    History reviewed. No pertinent surgical history. Social History   Tobacco Use   Smoking status: Former    Types: Cigars    Quit date: 2020    Years since quitting: 3.8   Smokeless tobacco: Never   Tobacco comments:    6 cigars/day  Vaping Use   Vaping Use: Never used  Substance Use Topics   Alcohol use: Not Currently    Comment: occasionally   Drug use: Never   Family History  Problem Relation Age of Onset   Breast cancer Mother    Allergies  Allergen Reactions   Penicillins Hives    Patient Care Team: Johnette Abraham, MD as PCP - General  (Internal Medicine)   Outpatient Medications Prior to Visit  Medication Sig   albuterol (VENTOLIN HFA) 108 (90 Base) MCG/ACT inhaler Inhale 1-2 puffs into the lungs every 6 (six) hours as needed for wheezing or shortness of breath.   Ascorbic Acid (VITAMIN C PO) Take by mouth. As needed   ASHWAGANDHA PO Take by mouth. Once daily   Black Elderberry (SAMBUCUS ELDERBERRY PO) Take 1 tablet by mouth daily.   Cyanocobalamin (VITAMIN B-12) 2500 MCG SUBL Place 1 tablet under the tongue daily.   Ferrous Sulfate (IRON PO) Take by mouth. Once daily   Ginsengs-Saw Palmetto (MULTI GINSENG & SAW PALMETTO) 500 MG CAPS Take 1 capsule by mouth daily.   ibuprofen (ADVIL) 200 MG tablet Take 800 mg by mouth every 6 (six) hours as needed.   [place] Ginseng 100 MG CAPS Take 1 capsule by mouth daily.   MAGNESIUM OXIDE PO Take by mouth. Once daily   Misc Natural Products (ENERGY SUPPORT PO) Take 1 tablet by mouth as needed.   Multiple Vitamins-Minerals (ZINC PO) Take by mouth. Once daily   TURMERIC PO Take by mouth. Once daily   UNABLE TO FIND Large Blood Pressure Cuff DX: I10   [  DISCONTINUED] amLODipine (NORVASC) 2.5 MG tablet Take 1 tablet (2.5 mg total) by mouth daily.   [DISCONTINUED] escitalopram (LEXAPRO) 10 MG tablet Take 1 tablet (10 mg total) by mouth daily.   [DISCONTINUED] doxycycline (VIBRAMYCIN) 100 MG capsule Take 1 capsule (100 mg total) by mouth 2 (two) times daily.   No facility-administered medications prior to visit.   Review of Systems  Constitutional:  Negative for chills and fever.       Feels mellow  HENT:  Negative for sore throat.   Respiratory:  Negative for cough and shortness of breath.   Cardiovascular:  Negative for chest pain, palpitations and leg swelling.  Gastrointestinal:  Negative for abdominal pain, blood in stool, constipation, diarrhea, nausea and vomiting.  Genitourinary:  Negative for dysuria and hematuria.  Musculoskeletal:  Negative for myalgias.  Skin:  Negative  for itching and rash.  Neurological:  Negative for dizziness and headaches.  Psychiatric/Behavioral:  Negative for depression and suicidal ideas.       Objective:     BP (!) 151/97   Pulse 86   Ht $R'5\' 11"'DD$  (1.803 m)   Wt 187 lb 9.6 oz (85.1 kg)   SpO2 95%   BMI 26.16 kg/m  BP Readings from Last 3 Encounters:  11/22/21 (!) 151/97  10/19/21 (!) 150/98  10/17/21 (!) 144/101   Physical Exam Vitals reviewed.  Constitutional:      General: He is not in acute distress.    Appearance: Normal appearance. He is not ill-appearing.  HENT:     Head: Normocephalic and atraumatic.     Nose: Nose normal. No congestion or rhinorrhea.     Mouth/Throat:     Mouth: Mucous membranes are moist.     Pharynx: Oropharynx is clear.  Eyes:     Extraocular Movements: Extraocular movements intact.     Conjunctiva/sclera: Conjunctivae normal.     Pupils: Pupils are equal, round, and reactive to light.  Cardiovascular:     Rate and Rhythm: Normal rate and regular rhythm.     Pulses: Normal pulses.     Heart sounds: Normal heart sounds. No murmur heard. Pulmonary:     Effort: Pulmonary effort is normal.     Breath sounds: Normal breath sounds. No wheezing, rhonchi or rales.  Abdominal:     General: Abdomen is flat. Bowel sounds are normal. There is no distension.     Palpations: Abdomen is soft.     Tenderness: There is no abdominal tenderness.  Musculoskeletal:        General: No swelling or deformity. Normal range of motion.     Cervical back: Normal range of motion.  Skin:    General: Skin is warm and dry.     Capillary Refill: Capillary refill takes less than 2 seconds.  Neurological:     General: No focal deficit present.     Mental Status: He is alert and oriented to person, place, and time.     Motor: No weakness.  Psychiatric:        Mood and Affect: Mood normal.        Behavior: Behavior normal.        Thought Content: Thought content normal.     Last CBC Lab Results  Component  Value Date   WBC 8.5 06/10/2016   HGB 16.1 06/10/2016   HCT 48.0 06/10/2016   MCV 88.5 06/10/2016   MCH 29.7 06/10/2016   RDW 13.5 06/10/2016   PLT 258 67/67/2094   Last metabolic panel  Lab Results  Component Value Date   GLUCOSE 99 10/19/2021   NA 140 10/19/2021   K 5.4 (H) 10/19/2021   CL 101 10/19/2021   CO2 22 10/19/2021   BUN 12 10/19/2021   CREATININE 0.94 10/19/2021   EGFR 107 10/19/2021   CALCIUM 10.4 (H) 10/19/2021   PROT 7.6 10/19/2021   ALBUMIN 4.8 10/19/2021   LABGLOB 2.8 10/19/2021   AGRATIO 1.7 10/19/2021   BILITOT <0.2 10/19/2021   ALKPHOS 72 10/19/2021   AST 28 10/19/2021   ALT 45 (H) 10/19/2021   ANIONGAP 7 06/10/2016      Assessment & Plan:    Routine Health Maintenance and Physical Exam   There is no immunization history on file for this patient.  Health Maintenance  Topic Date Due   HIV Screening  Never done   Hepatitis C Screening  Never done   TETANUS/TDAP  Never done   HPV VACCINES  Aged Out   INFLUENZA VACCINE  Discontinued   COVID-19 Vaccine  Discontinued    Discussed health benefits of physical activity, and encouraged him to engage in regular exercise appropriate for his age and condition.  Problem List Items Addressed This Visit       Hypertension - Primary    BP elevated again today, 151/97.  He is currently prescribed amlodipine 2.5 mg daily.  He states that poorly controlled HTN is a barrier to starting treatment for ADHD. -Start amlodipine-olmesartan 5-20 mg daily today -Discontinue amlodipine 2.5 mg daily -Follow-up in 2 weeks for BP check      Anxiety and depression    He continues to endorse difficulty focusing but states that he feels mellow today.  He is currently prescribed Lexapro 10 mg daily but is interested in increasing to 20 mg daily. -Increase Lexapro to 20 mg daily -Follow-up in 2 weeks for reassessment      Relevant Medications   escitalopram (LEXAPRO) 20 MG tablet   ADHD    He has to established care  with [facility] MD for treatment of ADHD.  He states they are planning to start a stimulant medication soon, but would like for his blood pressure to be under better control before doing so.  Addressing HTN as previously documented.  We will have him follow-up in 2 weeks.      Encounter for well adult exam with abnormal findings    Presenting today for his annual physical.  He predominantly wanted to discuss his mood, hypertension, and impending stimulant treatment for ADHD.  Preventative care items were reviewed.  He will return to care in 2 weeks.  At that time we will repeat labs, including one-time HIV/HCV screenings.      Return in about 2 weeks (around 12/06/2021).     Johnette Abraham, MD

## 2021-11-22 NOTE — Patient Instructions (Signed)
It was a pleasure to see you today.  Thank you for giving [place] the opportunity to be involved in your care.  Below is a brief recap of your visit and next steps.  We will plan to see you again in 2 weeks.  Summary We have increased to amlodipine-olmesartan 5-20 mg daily I have also increased lexapro to 20 mg daily Follow up in 2 weeks for blood pressure check

## 2021-11-26 DIAGNOSIS — F909 Attention-deficit hyperactivity disorder, unspecified type: Secondary | ICD-10-CM | POA: Insufficient documentation

## 2021-11-26 DIAGNOSIS — Z0001 Encounter for general adult medical examination with abnormal findings: Secondary | ICD-10-CM | POA: Insufficient documentation

## 2021-11-26 NOTE — Assessment & Plan Note (Addendum)
He has to established care with [facility] MD for treatment of ADHD.  He states they are planning to start a stimulant medication soon, but would like for his blood pressure to be under better control before doing so.  Addressing HTN as previously documented.  We will have him follow-up in 2 weeks.

## 2021-11-26 NOTE — Assessment & Plan Note (Signed)
BP elevated again today, 151/97.  He is currently prescribed amlodipine 2.5 mg daily.  He states that poorly controlled HTN is a barrier to starting treatment for ADHD. -Start amlodipine-olmesartan 5-20 mg daily today -Discontinue amlodipine 2.5 mg daily -Follow-up in 2 weeks for BP check

## 2021-11-26 NOTE — Assessment & Plan Note (Signed)
Presenting today for his annual physical.  He predominantly wanted to discuss his mood, hypertension, and impending stimulant treatment for ADHD.  Preventative care items were reviewed.  He will return to care in 2 weeks.  At that time we will repeat labs, including one-time HIV/HCV screenings.

## 2021-11-26 NOTE — Assessment & Plan Note (Signed)
He continues to endorse difficulty focusing but states that he feels mellow today.  He is currently prescribed Lexapro 10 mg daily but is interested in increasing to 20 mg daily. -Increase Lexapro to 20 mg daily -Follow-up in 2 weeks for reassessment

## 2021-11-30 ENCOUNTER — Encounter: Payer: 59 | Admitting: Internal Medicine

## 2021-12-07 ENCOUNTER — Ambulatory Visit (INDEPENDENT_AMBULATORY_CARE_PROVIDER_SITE_OTHER): Payer: 59 | Admitting: Internal Medicine

## 2021-12-07 ENCOUNTER — Encounter: Payer: Self-pay | Admitting: Internal Medicine

## 2021-12-07 VITALS — BP 127/73 | HR 111 | Ht 71.0 in | Wt 190.8 lb

## 2021-12-07 DIAGNOSIS — R519 Headache, unspecified: Secondary | ICD-10-CM

## 2021-12-07 DIAGNOSIS — F419 Anxiety disorder, unspecified: Secondary | ICD-10-CM

## 2021-12-07 DIAGNOSIS — F32A Depression, unspecified: Secondary | ICD-10-CM

## 2021-12-07 DIAGNOSIS — I10 Essential (primary) hypertension: Secondary | ICD-10-CM

## 2021-12-07 MED ORDER — ESCITALOPRAM OXALATE 20 MG PO TABS: 10.0000 mg | ORAL_TABLET | Freq: Every day | ORAL | 2 refills | Status: DC

## 2021-12-07 MED ORDER — AMLODIPINE-OLMESARTAN 5-20 MG PO TABS: 2.0000 | ORAL_TABLET | Freq: Every day | ORAL | 2 refills | Status: DC

## 2021-12-07 NOTE — Assessment & Plan Note (Signed)
He endorses recent headaches over the last week and notes that he hit his head on Sunday.  He does not currently have a headache, but describes dull, aching headaches that seem most consistent with tension type.  He has also been inadvertently taking more antihypertensive medication as prescribed.  No focal neurologic deficits present on exam. -Medication changes otherwise documented -Checking labs today to assess for renal/electrolyte abnormalities -I recommended that he take Tylenol for headache relief and avoid NSAIDs.

## 2021-12-07 NOTE — Assessment & Plan Note (Signed)
Lexapro was increased to 20 mg daily at his last appointment.  He endorses recent fatigue and is concerned that this may be related to his increased dose of Lexapro.  He would like to decrease Lexapro to 10 mg daily. -Decrease Lexapro to 10 mg daily

## 2021-12-07 NOTE — Progress Notes (Signed)
Established Patient Office Visit  Subjective   Patient ID: Nathaniel Medina, male    DOB: 1985/07/09  Age: 36 y.o. MRN: [id number]  Chief Complaint  Patient presents with   Follow-up   Headache    Migraines and sick feeling for a little while. Bumped head at work and ever since hasn't felt right    Nathaniel Medina returns to care today.  Nathaniel Medina is a 36 year old male with past medical history significant for HTN, anxiety/depression, and ADHD.  Nathaniel Medina was last seen by me on 10/26 for his annual exam.  That time his blood pressure was elevated and amlodipine-olmesartan 5-20 mg daily was started.  Lexapro was also increased to 20 mg daily for depression.  2-week follow-up was arranged for BP check.  There have been no acute interval events.  Today Nathaniel Medina acute concern is recent headaches.  Nathaniel Medina states that undecided work last Sunday of headaches since that time.  Nathaniel Medina does not currently have a headache.  Nathaniel Medina also endorses increased fatigue over the last 2 weeks since increasing Lexapro and starting amlodipine-olmesartan.  Nathaniel Medina has inadvertently been taking 3 tablets of amlodipine-olmesartan instead of one tablet daily because Nathaniel Medina was given three separate bottles instead of one large bottle.  A 90-day supply was previously prescribed.  Past Medical History:  Diagnosis Date   Hypertension    Manic depression ([place])    History reviewed. No pertinent surgical history. Social History   Tobacco Use   Smoking status: Former    Types: Cigars    Quit date: 2020    Years since quitting: 3.8   Smokeless tobacco: Never   Tobacco comments:    6 cigars/day  Vaping Use   Vaping Use: Never used  Substance Use Topics   Alcohol use: Not Currently    Comment: occasionally   Drug use: Never   Family History  Problem Relation Age of Onset   Breast cancer Mother    Allergies  Allergen Reactions   Penicillins Hives   Review of Systems  Constitutional:  Positive for malaise/fatigue.  Neurological:  Positive for  headaches.  All other systems reviewed and are negative.    Objective:     BP 127/73   Pulse (!) 111   Ht _0  (1.803 m)   Wt 190 lb 12.8 oz (86.5 kg)   SpO2 97%   BMI 26.61 kg/m  BP Readings from Last 3 Encounters:  12/07/21 127/73  11/22/21 (!) 151/97  10/19/21 (!) 150/98   Physical Exam Vitals reviewed.  Constitutional:      General: Nathaniel Medina is not in acute distress.    Appearance: Normal appearance. Nathaniel Medina is not ill-appearing.  HENT:     Head: Normocephalic and atraumatic.     Nose: Nose normal. No congestion or rhinorrhea.     Mouth/Throat:     Mouth: Mucous membranes are moist.     Pharynx: Oropharynx is clear.  Eyes:     Extraocular Movements: Extraocular movements intact.     Conjunctiva/sclera: Conjunctivae normal.     Pupils: Pupils are equal, round, and reactive to light.  Cardiovascular:     Rate and Rhythm: Normal rate and regular rhythm.     Pulses: Normal pulses.     Heart sounds: Normal heart sounds. No murmur heard. Pulmonary:     Effort: Pulmonary effort is normal.     Breath sounds: Normal breath sounds. No wheezing, rhonchi or rales.  Abdominal:     General: Abdomen is flat. Bowel sounds  are normal. There is no distension.     Palpations: Abdomen is soft.     Tenderness: There is no abdominal tenderness.  Musculoskeletal:        General: No swelling or deformity. Normal range of motion.     Cervical back: Normal range of motion.  Skin:    General: Skin is warm and dry.     Capillary Refill: Capillary refill takes less than 2 seconds.  Neurological:     General: No focal deficit present.     Mental Status: Nathaniel Medina is alert and oriented to person, place, and time.     Motor: No weakness.  Psychiatric:        Mood and Affect: Mood normal.        Behavior: Behavior normal.        Thought Content: Thought content normal.    Last CBC Lab Results  Component Value Date   WBC 8.5 06/10/2016   HGB 16.1 06/10/2016   HCT 48.0 06/10/2016   MCV 88.5  06/10/2016   MCH 29.7 06/10/2016   RDW 13.5 06/10/2016   PLT 258 49/67/5916   Last metabolic panel Lab Results  Component Value Date   GLUCOSE 99 10/19/2021   NA 140 10/19/2021   K 5.4 (H) 10/19/2021   CL 101 10/19/2021   CO2 22 10/19/2021   BUN 12 10/19/2021   CREATININE 0.94 10/19/2021   EGFR 107 10/19/2021   CALCIUM 10.4 (H) 10/19/2021   PROT 7.6 10/19/2021   ALBUMIN 4.8 10/19/2021   LABGLOB 2.8 10/19/2021   AGRATIO 1.7 10/19/2021   BILITOT <0.2 10/19/2021   ALKPHOS 72 10/19/2021   AST 28 10/19/2021   ALT 45 (H) 10/19/2021   ANIONGAP 7 06/10/2016     Assessment & Plan:   Problem List Items Addressed This Visit       Hypertension    BP 127/73 today.  Azor 5-20 milligrams daily was prescribed at his last appointment, however as previously documented Nathaniel Medina has been taking 3 tablets daily.  This may explain the symptoms Nathaniel Medina is currently endorsing. -We reviewed that taking 3 tablets at a time is above the maximum doses of both amlodipine and olmesartan. -I have increased amlodipine-olmesartan to 10-40 mg daily.  Nathaniel Medina has been instructed to take 1 tablet daily. -We will repeat BMP today      Anxiety and depression    Lexapro was increased to 20 mg daily at his last appointment.  Nathaniel Medina endorses recent fatigue and is concerned that this may be related to his increased dose of Lexapro.  Nathaniel Medina would like to decrease Lexapro to 10 mg daily. -Decrease Lexapro to 10 mg daily      Increased frequency of headaches - Primary    Nathaniel Medina endorses recent headaches over the last week and notes that Nathaniel Medina hit his head on Sunday.  Nathaniel Medina does not currently have a headache, but describes dull, aching headaches that seem most consistent with tension type.  Nathaniel Medina has also been inadvertently taking more antihypertensive medication as prescribed.  No focal neurologic deficits present on exam. -Medication changes otherwise documented -Checking labs today to assess for renal/electrolyte abnormalities -I recommended that  Nathaniel Medina take Tylenol for headache relief and avoid NSAIDs.       Return in about 4 weeks (around 01/04/2022).    Johnette Abraham, MD

## 2021-12-07 NOTE — Patient Instructions (Signed)
It was a pleasure to see you today.  Thank you for giving [place] the opportunity to be involved in your care.  Below is a brief recap of your visit and next steps.  We will plan to see you again in 4 weeks.  Summary Take 2 tablets of amlodipine-olmesartan 5-20 mg daily We will check your kidney function and electrolytes today Decrease lexapro to 10 mg daily

## 2021-12-07 NOTE — Assessment & Plan Note (Signed)
BP 127/73 today.  Azor 5-20 milligrams daily was prescribed at his last appointment, however as previously documented he has been taking 3 tablets daily.  This may explain the symptoms he is currently endorsing. -We reviewed that taking 3 tablets at a time is above the maximum doses of both amlodipine and olmesartan. -I have increased amlodipine-olmesartan to 10-40 mg daily.  He has been instructed to take 1 tablet daily. -We will repeat BMP today

## 2021-12-08 LAB — BASIC METABOLIC PANEL
BUN/Creatinine Ratio: 14 (ref 9–20)
BUN: 14 mg/dL (ref 6–20)
CO2: 23 mmol/L (ref 20–29)
Calcium: 9.6 mg/dL (ref 8.7–10.2)
Chloride: 104 mmol/L (ref 96–106)
Creatinine, Ser: 0.97 mg/dL (ref 0.76–1.27)
Glucose: 107 mg/dL — ABNORMAL HIGH (ref 70–99)
Potassium: 4.2 mmol/L (ref 3.5–5.2)
Sodium: 139 mmol/L (ref 134–144)
eGFR: 103 mL/min/{1.73_m2} (ref >59)

## 2022-01-04 ENCOUNTER — Encounter: Payer: Self-pay | Admitting: Internal Medicine

## 2022-01-04 ENCOUNTER — Ambulatory Visit (INDEPENDENT_AMBULATORY_CARE_PROVIDER_SITE_OTHER): Payer: 59 | Admitting: Internal Medicine

## 2022-01-04 VITALS — BP 124/83 | HR 88 | Ht 72.0 in | Wt 192.2 lb

## 2022-01-04 DIAGNOSIS — F419 Anxiety disorder, unspecified: Secondary | ICD-10-CM | POA: Diagnosis not present

## 2022-01-04 DIAGNOSIS — I10 Essential (primary) hypertension: Secondary | ICD-10-CM | POA: Diagnosis not present

## 2022-01-04 DIAGNOSIS — F32A Depression, unspecified: Secondary | ICD-10-CM | POA: Diagnosis not present

## 2022-01-04 MED ORDER — ESCITALOPRAM OXALATE 20 MG PO TABS: 20.0000 mg | ORAL_TABLET | Freq: Every day | ORAL | 2 refills | Status: DC

## 2022-01-04 NOTE — Assessment & Plan Note (Signed)
He previously requested to increase Lexapro to 20 mg daily, but then asked to decrease back to 10 mg due to concern for fatigue.  However, he reports today that he has continued taking Lexapro 20 mg and suspects that his previous fatigue was related to taking his antihypertensive medication incorrectly. -No changes today.  Continue Lexapro 20 mg daily.

## 2022-01-04 NOTE — Progress Notes (Signed)
Established Patient Office Visit  Subjective   Patient ID: Nathaniel Medina, male    DOB: [birth year]  Age: 36 y.o. MRN: [id number]  Chief Complaint  Patient presents with   Hypertension    Follow up   [name] returns today care today. He was last seen by me on 11/10 for HTN follow-up.  At that time it was revealed that he had inadvertently been taking 3 tablets of amlodipine-olmesartan instead of 1 tablet as prescribed.  He endorsed headaches and feeling ill.  Labs were checked, amlodipine-olmesartan was increased to 10-40 mg daily, and 4-week follow-up was arranged.  There have been no acute interval events.  Today [name] states that his headaches have resolved.  He has no acute concerns to discuss.  He has been taking antihypertensive medication as prescribed.  Past Medical History:  Diagnosis Date   Hypertension    Manic depression ([place])    History reviewed. No pertinent surgical history. Social History   Tobacco Use   Smoking status: Former    Types: Cigars    Quit date: 2020    Years since quitting: 3.9   Smokeless tobacco: Never   Tobacco comments:    6 cigars/day  Vaping Use   Vaping Use: Never used  Substance Use Topics   Alcohol use: Not Currently    Comment: occasionally   Drug use: Never   Family History  Problem Relation Age of Onset   Breast cancer Mother    Allergies  Allergen Reactions   Penicillins Hives   Review of Systems  Constitutional:  Positive for malaise/fatigue.  All other systems reviewed and are negative.    Objective:     BP 124/83   Pulse 88   Ht 6' (1.829 m)   Wt 192 lb 3.2 oz (87.2 kg)   SpO2 96%   BMI 26.07 kg/m  BP Readings from Last 3 Encounters:  01/04/22 124/83  12/07/21 127/73  11/22/21 (!) 151/97   Physical Exam Vitals reviewed.  Constitutional:      General: He is not in acute distress.    Appearance: Normal appearance. He is not ill-appearing.  HENT:     Head: Normocephalic and atraumatic.     Nose: Nose  normal. No congestion or rhinorrhea.     Mouth/Throat:     Mouth: Mucous membranes are moist.     Pharynx: Oropharynx is clear.  Eyes:     Extraocular Movements: Extraocular movements intact.     Conjunctiva/sclera: Conjunctivae normal.     Pupils: Pupils are equal, round, and reactive to light.  Cardiovascular:     Rate and Rhythm: Normal rate and regular rhythm.     Pulses: Normal pulses.     Heart sounds: Normal heart sounds. No murmur heard. Pulmonary:     Effort: Pulmonary effort is normal.     Breath sounds: Normal breath sounds. No wheezing, rhonchi or rales.  Abdominal:     General: Abdomen is flat. Bowel sounds are normal. There is no distension.     Palpations: Abdomen is soft.     Tenderness: There is no abdominal tenderness.  Musculoskeletal:        General: No swelling or deformity. Normal range of motion.     Cervical back: Normal range of motion.  Skin:    General: Skin is warm and dry.     Capillary Refill: Capillary refill takes less than 2 seconds.  Neurological:     General: No focal deficit present.  Mental Status: He is alert and oriented to person, place, and time.     Motor: No weakness.  Psychiatric:        Mood and Affect: Mood normal.        Behavior: Behavior normal.        Thought Content: Thought content normal.    Last CBC Lab Results  Component Value Date   WBC 8.5 06/10/2016   HGB 16.1 06/10/2016   HCT 48.0 06/10/2016   MCV 88.5 06/10/2016   MCH 29.7 06/10/2016   RDW 13.5 06/10/2016   PLT 258 53/96/7289   Last metabolic panel Lab Results  Component Value Date   GLUCOSE 107 (H) 12/07/2021   NA 139 12/07/2021   K 4.2 12/07/2021   CL 104 12/07/2021   CO2 23 12/07/2021   BUN 14 12/07/2021   CREATININE 0.97 12/07/2021   EGFR 103 12/07/2021   CALCIUM 9.6 12/07/2021   PROT 7.6 10/19/2021   ALBUMIN 4.8 10/19/2021   LABGLOB 2.8 10/19/2021   AGRATIO 1.7 10/19/2021   BILITOT <0.2 10/19/2021   ALKPHOS 72 10/19/2021   AST 28  10/19/2021   ALT 45 (H) 10/19/2021   ANIONGAP 7 06/10/2016     Assessment & Plan:   Problem List Items Addressed This Visit       Hypertension - Primary    He is currently prescribed amlodipine-olmesartan 10-40 mg daily for treatment of hypertension.  His blood pressure today is adequately controlled, 124/83.  Previously he had been taking 3 tablets of amlodipine-olmesartan 5-20 mg and endorsed headaches.  Labs at that time were unremarkable.  Most recently he has been taking 2 tablets of amlodipine-olmesartan 5-20 mg on a daily basis. -No medication changes today -Follow-up in 3 months      Anxiety and depression    He previously requested to increase Lexapro to 20 mg daily, but then asked to decrease back to 10 mg due to concern for fatigue.  However, he reports today that he has continued taking Lexapro 20 mg and suspects that his previous fatigue was related to taking his antihypertensive medication incorrectly. -No changes today.  Continue Lexapro 20 mg daily.       Return in about 3 months (around 04/05/2022) for HTN, update labwork.    Johnette Abraham, MD

## 2022-01-04 NOTE — Assessment & Plan Note (Signed)
He is currently prescribed amlodipine-olmesartan 10-40 mg daily for treatment of hypertension.  His blood pressure today is adequately controlled, 124/83.  Previously he had been taking 3 tablets of amlodipine-olmesartan 5-20 mg and endorsed headaches.  Labs at that time were unremarkable.  Most recently he has been taking 2 tablets of amlodipine-olmesartan 5-20 mg on a daily basis. -No medication changes today -Follow-up in 3 months

## 2022-01-04 NOTE — Patient Instructions (Signed)
It was a pleasure to see you today.  Thank you for giving us the opportunity to be involved in your care.  Below is a brief recap of your visit and next steps.  We will plan to see you again in 3 months.  Summary No medication changes today. Your blood pressure looks great. We will plan for follow up in 3 months.   

## 2022-02-05 ENCOUNTER — Other Ambulatory Visit: Payer: Self-pay

## 2022-02-05 ENCOUNTER — Other Ambulatory Visit: Payer: Self-pay | Admitting: Internal Medicine

## 2022-02-05 ENCOUNTER — Telehealth: Payer: Self-pay | Admitting: Internal Medicine

## 2022-02-05 DIAGNOSIS — F419 Anxiety disorder, unspecified: Secondary | ICD-10-CM

## 2022-02-05 DIAGNOSIS — I10 Essential (primary) hypertension: Secondary | ICD-10-CM

## 2022-02-05 DIAGNOSIS — F32A Depression, unspecified: Secondary | ICD-10-CM

## 2022-02-05 MED ORDER — AMLODIPINE-OLMESARTAN 5-20 MG PO TABS: 2.0000 | ORAL_TABLET | Freq: Every day | ORAL | 2 refills | Status: AC

## 2022-02-05 MED ORDER — ESCITALOPRAM OXALATE 20 MG PO TABS: 20.0000 mg | ORAL_TABLET | Freq: Every day | ORAL | 2 refills | Status: AC

## 2022-02-05 MED ORDER — ESCITALOPRAM OXALATE 20 MG PO TABS: 20.0000 mg | ORAL_TABLET | Freq: Every day | ORAL | 2 refills | Status: DC

## 2022-02-05 MED ORDER — AMLODIPINE-OLMESARTAN 5-20 MG PO TABS: 2.0000 | ORAL_TABLET | Freq: Every day | ORAL | 2 refills | Status: DC

## 2022-02-05 NOTE — Telephone Encounter (Signed)
Patient called need med refill  amLODipine-olmesartan (AZOR) 5-20 MG tablet   escitalopram (LEXAPRO) 20 MG tablet [[id number]]   Pharmacy: [facility] [place]

## 2022-02-05 NOTE — Telephone Encounter (Signed)
Refills sent

## 2022-04-12 ENCOUNTER — Ambulatory Visit: Payer: Self-pay | Admitting: Internal Medicine

## 2022-04-16 ENCOUNTER — Ambulatory Visit
Admission: RE | Admit: 2022-04-16 | Discharge: 2022-04-16 | Disposition: A | Discharge status: Home / Self Care | Payer: Commercial Managed Care - PPO | Source: Ambulatory Visit | Attending: Family Medicine | Admitting: Family Medicine

## 2022-04-16 VITALS — BP 122/84 | HR 74 | Temp 98.0°F | Resp 20

## 2022-04-16 DIAGNOSIS — U071 COVID-19: Secondary | ICD-10-CM | POA: Insufficient documentation

## 2022-04-16 DIAGNOSIS — R509 Fever, unspecified: Secondary | ICD-10-CM | POA: Insufficient documentation

## 2022-04-16 DIAGNOSIS — J069 Acute upper respiratory infection, unspecified: Secondary | ICD-10-CM

## 2022-04-16 DIAGNOSIS — R059 Cough, unspecified: Secondary | ICD-10-CM | POA: Insufficient documentation

## 2022-04-16 LAB — POCT INFLUENZA A/B
Influenza A, POC: NEGATIVE
Influenza B, POC: NEGATIVE

## 2022-04-16 MED ORDER — PROMETHAZINE-DM 6.25-15 MG/5ML PO SYRP: 5.0000 mL | ORAL_SOLUTION | Freq: Four times a day (QID) | ORAL | 0 refills | Status: DC | PRN

## 2022-04-16 NOTE — ED Provider Notes (Signed)
[facility]    CSN: [id number] Arrival date & time: 04/16/22  1232      History   Chief Complaint Chief Complaint  Patient presents with   Cough    Sinus infection, allergies, fever - Entered by patient    HPI Nathaniel Medina is a 37 y.o. male.   Presenting today with 2-day history of nasal congestion, cough, fever, chills.  Denies chest pain, shortness of breath, abdominal pain, nausea vomiting or diarrhea.  So far taking Mucinex, DayQuil, ibuprofen, vapor rubs with no lasting relief but some mild temporary relief.  States son was sick with similar symptoms last week.  No known pertinent chronic medical problems.    Past Medical History:  Diagnosis Date   Hypertension    Manic depression [facility])     Patient Active Problem List   Diagnosis Date Noted   Increased frequency of headaches 12/07/2021   ADHD 11/26/2021   Encounter for well adult exam with abnormal findings 11/26/2021   Refused influenza vaccine 10/19/2021   Hypertension 10/19/2021   Anxiety and depression 10/19/2021   Sinobronchitis 10/19/2021    History reviewed. No pertinent surgical history.     Home Medications    Prior to Admission medications   Medication Sig Start Date End Date Taking? Authorizing Provider  promethazine-dextromethorphan (PROMETHAZINE-DM) 6.25-15 MG/5ML syrup Take 5 mLs by mouth 4 (four) times daily as needed. 04/16/22  Yes Volney American, PA-C  albuterol (VENTOLIN HFA) 108 (90 Base) MCG/ACT inhaler Inhale 1-2 puffs into the lungs every 6 (six) hours as needed for wheezing or shortness of breath. 10/07/21   Raspet, Erin K, PA-C  amLODipine-olmesartan (AZOR) 5-20 MG tablet Take 2 tablets by mouth daily. 02/05/22 05/06/22  Johnette Abraham, MD  Ascorbic Acid (VITAMIN C PO) Take by mouth. As needed    [provider]  ASHWAGANDHA PO Take by mouth. Once daily    [provider]  Black Elderberry (SAMBUCUS ELDERBERRY PO) Take 1 tablet by mouth daily.     [provider]  Cyanocobalamin (VITAMIN B-12) 2500 MCG SUBL Place 1 tablet under the tongue daily.    [provider]  escitalopram (LEXAPRO) 20 MG tablet Take 1 tablet (20 mg total) by mouth daily. 02/05/22 05/06/22  Johnette Abraham, MD  Ferrous Sulfate (IRON PO) Take by mouth. Once daily    [provider]  Ginsengs-Saw Palmetto [facility] GINSENG & SAW PALMETTO) 500 MG CAPS Take 1 capsule by mouth daily.    [provider]  ibuprofen (ADVIL) 200 MG tablet Take 800 mg by mouth every 6 (six) hours as needed.    [provider]  [place] Ginseng 100 MG CAPS Take 1 capsule by mouth daily.    [provider]  MAGNESIUM OXIDE PO Take by mouth. Once daily    [provider]  Misc Natural Products (ENERGY SUPPORT PO) Take 1 tablet by mouth as needed.    [provider]  Multiple Vitamins-Minerals (ZINC PO) Take by mouth. Once daily    [provider]  TURMERIC PO Take by mouth. Once daily    [provider]  UNABLE TO FIND Large Blood Pressure Cuff DX: I10 10/19/21   Paseda, Dewaine Conger, FNP    Family History Family History  Problem Relation Age of Onset   Breast cancer Mother     Social History Social History   Tobacco Use   Smoking status: Former    Types: Cigars    Quit date: 2020  Years since quitting: 4.2   Smokeless tobacco: Never   Tobacco comments:    6 cigars/day  Vaping Use   Vaping Use: Never used  Substance Use Topics   Alcohol use: Not Currently    Comment: occasionally   Drug use: Never     Allergies   Penicillins   Review of Systems Review of Systems Per HPI  Physical Exam Triage Vital Signs ED Triage Vitals  Enc Vitals Group     BP --      Pulse --      Resp --      Temp --      Temp Source 04/16/22 1250 Oral     SpO2 --      Weight --      Height --      Head Circumference --      Peak Flow --      Pain Score 04/16/22 1255 5     Pain Loc --      Pain Edu? --       Excl. in [place]? --    No data found.  Updated Vital Signs BP 122/84 (BP Location: Right Arm)   Pulse 74   Temp 98 F (36.7 C) (Oral)   Resp 20   SpO2 99%   Visual Acuity Right Eye Distance:   Left Eye Distance:   Bilateral Distance:    Right Eye Near:   Left Eye Near:    Bilateral Near:     Physical Exam Vitals and nursing note reviewed.  Constitutional:      Appearance: He is well-developed.  HENT:     Head: Atraumatic.     Right Ear: External ear normal.     Left Ear: External ear normal.     Nose: Rhinorrhea present.     Mouth/Throat:     Pharynx: Posterior oropharyngeal erythema present. No oropharyngeal exudate.  Eyes:     Conjunctiva/sclera: Conjunctivae normal.     Pupils: Pupils are equal, round, and reactive to light.  Cardiovascular:     Rate and Rhythm: Normal rate and regular rhythm.     Heart sounds: Normal heart sounds.  Pulmonary:     Effort: Pulmonary effort is normal. No respiratory distress.     Breath sounds: No wheezing or rales.  Musculoskeletal:        General: Normal range of motion.     Cervical back: Normal range of motion and neck supple.  Lymphadenopathy:     Cervical: No cervical adenopathy.  Skin:    General: Skin is warm and dry.  Neurological:     Mental Status: He is alert and oriented to person, place, and time.  Psychiatric:        Behavior: Behavior normal.      UC Treatments / Results  Labs (all labs ordered are listed, but only abnormal results are displayed) Labs Reviewed  SARS CORONAVIRUS 2 (TAT 6-24 HRS)  POCT INFLUENZA A/B    EKG   Radiology No results found.  Procedures Procedures (including critical care time)  Medications Ordered in UC Medications - No data to display  Initial Impression / Assessment and Plan / UC Course  I have reviewed the triage vital signs and the nursing notes.  Pertinent labs & imaging results that were available during my care of the patient were reviewed by me and  considered in my medical decision making (see chart for details).     Vitals and exam overall reassuring today, suspicious  for viral upper respiratory infection.  Rapid flu negative, COVID testing pending.  Continue supportive over-the-counter medications, home care and add Phenergan DM.  Work note given.  Return for worsening symptoms.  Final Clinical Impressions(s) / UC Diagnoses   Final diagnoses:  Viral URI with cough  Fever, unspecified   Discharge Instructions   None    ED Prescriptions     Medication Sig Dispense Auth. Provider   promethazine-dextromethorphan (PROMETHAZINE-DM) 6.25-15 MG/5ML syrup Take 5 mLs by mouth 4 (four) times daily as needed. 100 mL Volney American, [place]      PDMP not reviewed this encounter.   Volney American, [place] 04/16/22 1434

## 2022-04-16 NOTE — ED Triage Notes (Signed)
Pt reports he thinks he has a sinus infection. He has a cough, fever x 2 days.  Took dayquil, ibuprofen, vic vapor rub, mucinex but no relief.

## 2022-04-17 LAB — SARS CORONAVIRUS 2 (TAT 6-24 HRS): SARS Coronavirus 2: POSITIVE — AB

## 2022-05-03 ENCOUNTER — Ambulatory Visit: Payer: Self-pay | Admitting: Internal Medicine

## 2022-05-07 ENCOUNTER — Other Ambulatory Visit: Payer: Self-pay | Admitting: Internal Medicine

## 2022-05-07 DIAGNOSIS — I10 Essential (primary) hypertension: Secondary | ICD-10-CM

## 2022-05-19 ENCOUNTER — Other Ambulatory Visit: Payer: Self-pay | Admitting: Internal Medicine

## 2022-05-19 ENCOUNTER — Other Ambulatory Visit: Payer: Self-pay | Admitting: Nurse Practitioner

## 2022-05-19 DIAGNOSIS — I10 Essential (primary) hypertension: Secondary | ICD-10-CM

## 2022-05-19 DIAGNOSIS — F32A Depression, unspecified: Secondary | ICD-10-CM

## 2022-06-13 ENCOUNTER — Other Ambulatory Visit: Payer: Self-pay | Admitting: Nurse Practitioner

## 2022-06-13 DIAGNOSIS — F32A Depression, unspecified: Secondary | ICD-10-CM

## 2022-09-22 ENCOUNTER — Ambulatory Visit
Admission: RE | Admit: 2022-09-22 | Discharge: 2022-09-22 | Disposition: A | Discharge status: Home / Self Care | Payer: Commercial Managed Care - PPO | Source: Ambulatory Visit | Attending: Family Medicine | Admitting: Family Medicine

## 2022-09-22 VITALS — BP 152/96 | HR 83 | Temp 98.4°F | Resp 18

## 2022-09-22 DIAGNOSIS — S76119A Strain of unspecified quadriceps muscle, fascia and tendon, initial encounter: Secondary | ICD-10-CM | POA: Diagnosis not present

## 2022-09-22 MED ORDER — TIZANIDINE HCL 4 MG PO CAPS: 4.0000 mg | ORAL_CAPSULE | Freq: Three times a day (TID) | ORAL | 0 refills | Status: DC | PRN

## 2022-09-22 NOTE — ED Triage Notes (Signed)
Pt reports he is having sharp left inner thigh pains x 4 weeks +

## 2022-09-22 NOTE — Discharge Instructions (Addendum)
I suspect you have a mild strain to the inner quadricep muscles.  It is very important to stretch at least once a day prior to activities, massage the area, warm Epsom salt baths, use foam rollers to help additionally.  I have sent in a muscle relaxer, be cautious as it can cause drowsiness.  You may also take over-the-counter anti-inflammatory pain medication such as naproxen or ibuprofen as needed.

## 2022-09-22 NOTE — ED Provider Notes (Signed)
[facility]    CSN: [id number] Arrival date & time: 09/22/22  0913      History   Chief Complaint No chief complaint on file.   HPI Nathaniel Medina is a 37 y.o. male.   Pt reports he is having sharp left inner thigh pains x 4 weeks +      Past Medical History:  Diagnosis Date   Hypertension    Manic depression (HCC)     Patient Active Problem List   Diagnosis Date Noted   Increased frequency of headaches 12/07/2021   ADHD 11/26/2021   Encounter for well adult exam with abnormal findings 11/26/2021   Refused influenza vaccine 10/19/2021   Hypertension 10/19/2021   Anxiety and depression 10/19/2021   Sinobronchitis 10/19/2021    History reviewed. No pertinent surgical history.     Home Medications    Prior to Admission medications   Medication Sig Start Date End Date Taking? Authorizing Provider  tiZANidine (ZANAFLEX) 4 MG capsule Take 1 capsule (4 mg total) by mouth 3 (three) times daily as needed for muscle spasms. Do not drink alcohol or drive while taking this medication.  May cause drowsiness. 09/22/22  Yes Particia Nearing, PA-C  albuterol (VENTOLIN HFA) 108 (90 Base) MCG/ACT inhaler Inhale 1-2 puffs into the lungs every 6 (six) hours as needed for wheezing or shortness of breath. 10/07/21   Raspet, Erin K, PA-C  amLODipine-olmesartan (AZOR) 5-20 MG tablet Take 2 tablets by mouth daily. 02/05/22 05/06/22  Billie Lade, MD  Ascorbic Acid (VITAMIN C PO) Take by mouth. As needed    [provider]  ASHWAGANDHA PO Take by mouth. Once daily    [provider]  Black Elderberry (SAMBUCUS ELDERBERRY PO) Take 1 tablet by mouth daily.    [provider]  Cyanocobalamin (VITAMIN B-12) 2500 MCG SUBL Place 1 tablet under the tongue daily.    [provider]  escitalopram (LEXAPRO) 20 MG tablet Take 1 tablet (20 mg total) by mouth daily. 02/05/22 05/06/22  Billie Lade, MD  Ferrous Sulfate (IRON PO) Take by mouth. Once  daily    [provider]  Ginsengs-Saw [facility] GINSENG & SAW PALMETTO) 500 MG CAPS Take 1 capsule by mouth daily.    [provider]  ibuprofen (ADVIL) 200 MG tablet Take 800 mg by mouth every 6 (six) hours as needed.    [provider]  [place] Ginseng 100 MG CAPS Take 1 capsule by mouth daily.    [provider]  MAGNESIUM OXIDE PO Take by mouth. Once daily    [provider]  Misc Natural Products (ENERGY SUPPORT PO) Take 1 tablet by mouth as needed.    [provider]  Multiple Vitamins-Minerals (ZINC PO) Take by mouth. Once daily    [provider]  promethazine-dextromethorphan (PROMETHAZINE-DM) 6.25-15 MG/5ML syrup Take 5 mLs by mouth 4 (four) times daily as needed. 04/16/22   Particia Nearing, PA-C  TURMERIC PO Take by mouth. Once daily    [provider]  UNABLE TO FIND Large Blood Pressure Cuff DX: I10 10/19/21   Paseda, Baird Kay, FNP    Family History Family History  Problem Relation Age of Onset   Breast cancer Mother     Social History Social History   Tobacco Use   Smoking status: Former    Types: Cigars    Quit date: 2020    Years since quitting: 4.6   Smokeless tobacco: Never   Tobacco  comments:    6 cigars/day  Vaping Use   Vaping status: Never Used  Substance Use Topics   Alcohol use: Not Currently    Comment: occasionally   Drug use: Never     Allergies   Penicillins   Review of Systems Review of Systems   Physical Exam Triage Vital Signs ED Triage Vitals  Encounter Vitals Group     BP 09/22/22 0943 (!) 152/96     Systolic BP Percentile --      Diastolic BP Percentile --      Pulse Rate 09/22/22 0943 83     Resp 09/22/22 0943 18     Temp 09/22/22 0943 98.4 F (36.9 C)     Temp Source 09/22/22 0943 Oral     SpO2 09/22/22 0943 98 %     Weight --      Height --      Head Circumference --      Peak Flow --      Pain Score 09/22/22 0944 0     Pain Loc --       Pain Education --      Exclude from Growth Chart --    No data found.  Updated Vital Signs BP (!) 152/96 (BP Location: Right Arm)   Pulse 83   Temp 98.4 F (36.9 C) (Oral)   Resp 18   SpO2 98%   Visual Acuity Right Eye Distance:   Left Eye Distance:   Bilateral Distance:    Right Eye Near:   Left Eye Near:    Bilateral Near:     Physical Exam   UC Treatments / Results  Labs (all labs ordered are listed, but only abnormal results are displayed) Labs Reviewed - No data to display  EKG   Radiology No results found.  Procedures Procedures (including critical care time)  Medications Ordered in UC Medications - No data to display  Initial Impression / Assessment and Plan / UC Course  I have reviewed the triage vital signs and the nursing notes.  Pertinent labs & imaging results that were available during my care of the patient were reviewed by me and considered in my medical decision making (see chart for details).     *** Final Clinical Impressions(s) / UC Diagnoses   Final diagnoses:  Strain of quadriceps, unspecified laterality, initial encounter     Discharge Instructions      I suspect you have a mild strain to the inner quadricep muscles.  It is very important to stretch at least once a day prior to activities, massage the area, warm Epsom salt baths, use foam rollers to help additionally.  I have sent in a muscle relaxer, be cautious as it can cause drowsiness.  You may also take over-the-counter anti-inflammatory pain medication such as naproxen or ibuprofen as needed.   ED Prescriptions     Medication Sig Dispense Auth. Provider   tiZANidine (ZANAFLEX) 4 MG capsule Take 1 capsule (4 mg total) by mouth 3 (three) times daily as needed for muscle spasms. Do not drink alcohol or drive while taking this medication.  May cause drowsiness. 15 capsule Particia Nearing, [place]      PDMP not reviewed this encounter.

## 2022-12-02 ENCOUNTER — Ambulatory Visit
Admission: RE | Admit: 2022-12-02 | Discharge: 2022-12-02 | Disposition: A | Discharge status: Home / Self Care | Payer: Commercial Managed Care - PPO | Source: Ambulatory Visit | Attending: Nurse Practitioner | Admitting: Nurse Practitioner

## 2022-12-02 VITALS — BP 156/104 | HR 98 | Temp 98.5°F | Resp 18

## 2022-12-02 DIAGNOSIS — Z1152 Encounter for screening for COVID-19: Secondary | ICD-10-CM | POA: Insufficient documentation

## 2022-12-02 DIAGNOSIS — J069 Acute upper respiratory infection, unspecified: Secondary | ICD-10-CM | POA: Diagnosis present

## 2022-12-02 MED ORDER — BENZONATATE 100 MG PO CAPS: 100.0000 mg | ORAL_CAPSULE | Freq: Three times a day (TID) | ORAL | 0 refills | Status: DC | PRN

## 2022-12-02 NOTE — ED Provider Notes (Signed)
[facility]    CSN: [id number] Arrival date & time: 12/02/22  1191      History   Chief Complaint Chief Complaint  Patient presents with   Cough    Bronchitis, sinus infection, or COVID - Entered by patient    HPI Nathaniel Medina is a 37 y.o. male.   Patient presents today with 4-day history of bodyaches, chills, congested and dry cough, runny nose, headache, decreased appetite, and fatigue.  Patient denies shortness of breath, wheezing, chest pain or tightness, stuffy nose, sore throat, ear pain, abdominal pain, nausea/vomiting, and diarrhea.  Reports his parents are sick with similar symptoms.  Has taken over-the-counter cough drops, Mucinex, over-the-counter cough and cold medication, ibuprofen, and warm soup for symptoms with little relief.    Past Medical History:  Diagnosis Date   Hypertension    Manic depression [facility])     Patient Active Problem List   Diagnosis Date Noted   Increased frequency of headaches 12/07/2021   ADHD 11/26/2021   Encounter for well adult exam with abnormal findings 11/26/2021   Refused influenza vaccine 10/19/2021   Hypertension 10/19/2021   Anxiety and depression 10/19/2021   Sinobronchitis 10/19/2021    History reviewed. No pertinent surgical history.     Home Medications    Prior to Admission medications   Medication Sig Start Date End Date Taking? Authorizing Provider  Ascorbic Acid (VITAMIN C PO) Take by mouth. As needed   Yes [provider]  ASHWAGANDHA PO Take by mouth. Once daily   Yes [provider]  benzonatate (TESSALON) 100 MG capsule Take 1 capsule (100 mg total) by mouth 3 (three) times daily as needed for cough. Do not take with alcohol or while driving or operating heavy machinery.  May cause drowsiness. 12/02/22  Yes Valentino Nose, NP  Cyanocobalamin (VITAMIN B-12) 2500 MCG SUBL Place 1 tablet under the tongue daily.   Yes [provider]  Ferrous Sulfate (IRON PO) Take by  mouth. Once daily   Yes [provider]  Ginsengs-Saw [facility] GINSENG & SAW PALMETTO) 500 MG CAPS Take 1 capsule by mouth daily.   Yes [provider]  ibuprofen (ADVIL) 200 MG tablet Take 800 mg by mouth every 6 (six) hours as needed.   Yes [provider]  TURMERIC PO Take by mouth. Once daily   Yes [provider]  albuterol (VENTOLIN HFA) 108 (90 Base) MCG/ACT inhaler Inhale 1-2 puffs into the lungs every 6 (six) hours as needed for wheezing or shortness of breath. 10/07/21   Raspet, Erin K, PA-C  amLODipine-olmesartan (AZOR) 5-20 MG tablet Take 2 tablets by mouth daily. 02/05/22 05/06/22  Billie Lade, MD  Black Elderberry (SAMBUCUS ELDERBERRY PO) Take 1 tablet by mouth daily.    [provider]  escitalopram (LEXAPRO) 20 MG tablet Take 1 tablet (20 mg total) by mouth daily. 02/05/22 05/06/22  Billie Lade, MD  [place] Ginseng 100 MG CAPS Take 1 capsule by mouth daily.    [provider]  MAGNESIUM OXIDE PO Take by mouth. Once daily    [provider]  Misc Natural Products (ENERGY SUPPORT PO) Take 1 tablet by mouth as needed.    [provider]  Multiple Vitamins-Minerals (ZINC PO) Take by mouth. Once daily    [provider]  tiZANidine (ZANAFLEX) 4 MG capsule Take 1 capsule (4 mg total) by mouth 3 (three) times daily as needed for muscle spasms. Do not drink alcohol or  drive while taking this medication.  May cause drowsiness. 09/22/22   Particia Nearing, PA-C  UNABLE TO FIND Large Blood Pressure Cuff DX: I10 10/19/21   Paseda, Baird Kay, FNP    Family History Family History  Problem Relation Age of Onset   Breast cancer Mother     Social History Social History   Tobacco Use   Smoking status: Former    Types: Cigars    Quit date: 2020    Years since quitting: 4.8   Smokeless tobacco: Never   Tobacco comments:    6 cigars/day  Vaping Use   Vaping status: Never Used  Substance Use  Topics   Alcohol use: Not Currently    Comment: occasionally   Drug use: Never     Allergies   Penicillins   Review of Systems Review of Systems Per HPI  Physical Exam Triage Vital Signs ED Triage Vitals  Encounter Vitals Group     BP 12/02/22 0943 (!) 156/104     Systolic BP Percentile --      Diastolic BP Percentile --      Pulse Rate 12/02/22 0939 98     Resp 12/02/22 0939 18     Temp 12/02/22 0939 98.5 F (36.9 C)     Temp Source 12/02/22 0939 Oral     SpO2 12/02/22 0939 98 %     Weight --      Height --      Head Circumference --      Peak Flow --      Pain Score 12/02/22 0940 0     Pain Loc --      Pain Education --      Exclude from Growth Chart --    No data found.  Updated Vital Signs BP (!) 156/104   Pulse 98   Temp 98.5 F (36.9 C) (Oral)   Resp 18   SpO2 98%   Visual Acuity Right Eye Distance:   Left Eye Distance:   Bilateral Distance:    Right Eye Near:   Left Eye Near:    Bilateral Near:     Physical Exam Vitals and nursing note reviewed.  Constitutional:      General: He is not in acute distress.    Appearance: Normal appearance. He is not ill-appearing or toxic-appearing.  HENT:     Head: Normocephalic and atraumatic.     Right Ear: Tympanic membrane, ear canal and external ear normal.     Left Ear: Tympanic membrane, ear canal and external ear normal.     Nose: Congestion and rhinorrhea present.     Mouth/Throat:     Mouth: Mucous membranes are moist.     Pharynx: Oropharynx is clear. No oropharyngeal exudate or posterior oropharyngeal erythema.  Eyes:     General: No scleral icterus.    Extraocular Movements: Extraocular movements intact.  Cardiovascular:     Rate and Rhythm: Normal rate and regular rhythm.  Pulmonary:     Effort: Pulmonary effort is normal. No respiratory distress.     Breath sounds: Normal breath sounds. No wheezing, rhonchi or rales.  Musculoskeletal:     Cervical back: Normal range of motion and neck  supple.  Lymphadenopathy:     Cervical: No cervical adenopathy.  Skin:    General: Skin is warm and dry.     Coloration: Skin is not jaundiced or pale.     Findings: No erythema or rash.  Neurological:     Mental  Status: He is alert and oriented to person, place, and time.  Psychiatric:        Behavior: Behavior is cooperative.      UC Treatments / Results  Labs (all labs ordered are listed, but only abnormal results are displayed) Labs Reviewed  SARS CORONAVIRUS 2 (TAT 6-24 HRS)    EKG   Radiology No results found.  Procedures Procedures (including critical care time)  Medications Ordered in UC Medications - No data to display  Initial Impression / Assessment and Plan / UC Course  I have reviewed the triage vital signs and the nursing notes.  Pertinent labs & imaging results that were available during my care of the patient were reviewed by me and considered in my medical decision making (see chart for details).   Patient is well-appearing, afebrile, not tachycardic, not tachypneic, oxygenating well on room air.  Patient is mildly hypertensive in urgent care today.  1. Viral URI with cough 2. Encounter for screening for COVID-19 Suspect viral etiology Vitals and exam are reassuring COVID-19 testing via PCR obtained given length of symptoms as patient wants to know for work purposes Supportive care discussed with patient Recommend starting Tessalon Perles, continue Mucinex, saline rinses Work excuse provided  The patient was given the opportunity to ask questions.  All questions answered to their satisfaction.  The patient is in agreement to this plan.    Final Clinical Impressions(s) / UC Diagnoses   Final diagnoses:  Viral URI with cough  Encounter for screening for COVID-19     Discharge Instructions      You have a viral upper respiratory infection.  Symptoms should improve over the next week to 10 days.  If you develop chest pain or shortness of  breath, go to the emergency room.  We have tested you today for COVID-19.  You will see the results in Mychart and we will call you with positive results.  Please stay home and isolate until you are aware of the results.    Some things that can make you feel better are: - Increased rest - Increasing fluid with water/sugar free electrolytes - Acetaminophen and ibuprofen as needed for fever/pain - Salt water gargling, chloraseptic spray and throat lozenges for sore throat - OTC guaifenesin (Mucinex) 600 mg twice daily for congestion - Saline sinus flushes or a neti pot - Humidifying the air -Tessalon Perles every 8 hours as needed for dry cough       ED Prescriptions     Medication Sig Dispense Auth. Provider   benzonatate (TESSALON) 100 MG capsule Take 1 capsule (100 mg total) by mouth 3 (three) times daily as needed for cough. Do not take with alcohol or while driving or operating heavy machinery.  May cause drowsiness. 21 capsule Valentino Nose, NP      PDMP not reviewed this encounter.   Valentino Nose, NP 12/02/22 1003

## 2022-12-02 NOTE — Discharge Instructions (Addendum)

## 2022-12-02 NOTE — ED Triage Notes (Signed)
Pt presents with cough, headache, runny nose, body aches, fatigue that started Friday. Taking OTC cold and flu medication, ibuprofen, mucinex with little relief.

## 2022-12-03 LAB — SARS CORONAVIRUS 2 (TAT 6-24 HRS): SARS Coronavirus 2: NEGATIVE

## 2022-12-10 ENCOUNTER — Telehealth: Payer: Self-pay | Admitting: Internal Medicine

## 2022-12-10 NOTE — Telephone Encounter (Signed)
Copied from CRM [id number]. Topic: Clinical - Medical Advice >> Dec 10, 2022  3:20 PM Herbert Seta B wrote: Reason for CRM: Patient calling due to L elbow pain and intermittent high BP. Scheduled appointment for 11/25, patient would like to speak to care team for advice on elbow pain care.

## 2022-12-10 NOTE — Telephone Encounter (Signed)
Left message to call back  

## 2022-12-15 ENCOUNTER — Other Ambulatory Visit: Payer: Self-pay

## 2022-12-15 ENCOUNTER — Ambulatory Visit
Admission: RE | Admit: 2022-12-15 | Discharge: 2022-12-15 | Disposition: A | Discharge status: Home / Self Care | Payer: Commercial Managed Care - PPO | Source: Ambulatory Visit | Attending: Family Medicine | Admitting: Family Medicine

## 2022-12-15 VITALS — BP 152/101 | HR 85 | Temp 97.9°F | Resp 20

## 2022-12-15 DIAGNOSIS — M778 Other enthesopathies, not elsewhere classified: Secondary | ICD-10-CM

## 2022-12-15 MED ORDER — PREDNISONE 20 MG PO TABS: 40.0000 mg | ORAL_TABLET | Freq: Every day | ORAL | 0 refills | Status: DC

## 2022-12-15 MED ORDER — TIZANIDINE HCL 4 MG PO CAPS: 4.0000 mg | ORAL_CAPSULE | Freq: Three times a day (TID) | ORAL | 0 refills | Status: DC | PRN

## 2022-12-15 NOTE — ED Provider Notes (Signed)
[facility]    CSN: [id number] Arrival date & time: 12/15/22  0859      History   Chief Complaint Chief Complaint  Patient presents with   Arm Injury    Left elbow - Entered by patient    HPI Nathaniel Medina is a 37 y.o. male.   Patient presenting today with pain and stiffness to the left elbow worse with movement for the past 3 weeks or so.  States he does a lot of repetitive heavy lifting at work and thinks this may have caused his symptoms.  Symptoms tend to improve after taking several days off and restart once he returns to work.  Denies numbness tingling or weakness of the arm, swelling, discoloration, direct injury to the area.  Trying over-the-counter pain relievers, elbow wraps with no relief.    Past Medical History:  Diagnosis Date   Hypertension    Manic depression [facility])     Patient Active Problem List   Diagnosis Date Noted   Increased frequency of headaches 12/07/2021   ADHD 11/26/2021   Encounter for well adult exam with abnormal findings 11/26/2021   Refused influenza vaccine 10/19/2021   Hypertension 10/19/2021   Anxiety and depression 10/19/2021   Sinobronchitis 10/19/2021    History reviewed. No pertinent surgical history.     Home Medications    Prior to Admission medications   Medication Sig Start Date End Date Taking? Authorizing Provider  predniSONE (DELTASONE) 20 MG tablet Take 2 tablets (40 mg total) by mouth daily with breakfast. 12/15/22  Yes Particia Nearing, PA-C  tiZANidine (ZANAFLEX) 4 MG capsule Take 1 capsule (4 mg total) by mouth 3 (three) times daily as needed for muscle spasms. Do not drink alcohol or drive while taking this medication. May cause drowsiness 12/15/22  Yes Particia Nearing, PA-C  albuterol (VENTOLIN HFA) 108 (90 Base) MCG/ACT inhaler Inhale 1-2 puffs into the lungs every 6 (six) hours as needed for wheezing or shortness of breath. 10/07/21   Raspet, Erin K, PA-C  amLODipine-olmesartan (AZOR)  5-20 MG tablet Take 2 tablets by mouth daily. 02/05/22 05/06/22  Billie Lade, MD  Ascorbic Acid (VITAMIN C PO) Take by mouth. As needed    [provider]  ASHWAGANDHA PO Take by mouth. Once daily    [provider]  benzonatate (TESSALON) 100 MG capsule Take 1 capsule (100 mg total) by mouth 3 (three) times daily as needed for cough. Do not take with alcohol or while driving or operating heavy machinery.  May cause drowsiness. 12/02/22   Valentino Nose, NP  Black Elderberry (SAMBUCUS ELDERBERRY PO) Take 1 tablet by mouth daily. Patient not taking: Reported on 12/15/2022    [provider]  Cyanocobalamin (VITAMIN B-12) 2500 MCG SUBL Place 1 tablet under the tongue daily.    [provider]  escitalopram (LEXAPRO) 20 MG tablet Take 1 tablet (20 mg total) by mouth daily. 02/05/22 05/06/22  Billie Lade, MD  Ferrous Sulfate (IRON PO) Take by mouth. Once daily    [provider]  Ginsengs-Saw [facility] [facility] GINSENG & SAW PALMETTO) 500 MG CAPS Take 1 capsule by mouth daily.    [provider]  ibuprofen (ADVIL) 200 MG tablet Take 800 mg by mouth every 6 (six) hours as needed.    [provider]  [place] Ginseng 100 MG CAPS Take 1 capsule by mouth daily.    [provider]  MAGNESIUM OXIDE PO Take by mouth. Once daily  [provider]  Misc Natural Products (ENERGY SUPPORT PO) Take 1 tablet by mouth as needed.    [provider]  Multiple Vitamins-Minerals (ZINC PO) Take by mouth. Once daily    [provider]  tiZANidine (ZANAFLEX) 4 MG capsule Take 1 capsule (4 mg total) by mouth 3 (three) times daily as needed for muscle spasms. Do not drink alcohol or drive while taking this medication.  May cause drowsiness. 09/22/22   Particia Nearing, PA-C  TURMERIC PO Take by mouth. Once daily    [provider]  UNABLE TO FIND Large Blood Pressure Cuff DX: I10 10/19/21   Paseda, Baird Kay, FNP     Family History Family History  Problem Relation Age of Onset   Breast cancer Mother     Social History Social History   Tobacco Use   Smoking status: Former    Types: Cigars    Quit date: 2020    Years since quitting: 4.8   Smokeless tobacco: Never   Tobacco comments:    6 cigars/day  Vaping Use   Vaping status: Never Used  Substance Use Topics   Alcohol use: Not Currently    Comment: occasionally   Drug use: Never     Allergies   Penicillins   Review of Systems Review of Systems Per HPI  Physical Exam Triage Vital Signs ED Triage Vitals  Encounter Vitals Group     BP 12/15/22 0906 (!) 152/101     Systolic BP Percentile --      Diastolic BP Percentile --      Pulse Rate 12/15/22 0906 85     Resp 12/15/22 0906 20     Temp 12/15/22 0906 97.9 F (36.6 C)     Temp Source 12/15/22 0906 Oral     SpO2 12/15/22 0906 96 %     Weight --      Height --      Head Circumference --      Peak Flow --      Pain Score 12/15/22 0904 7     Pain Loc --      Pain Education --      Exclude from Growth Chart --    No data found.  Updated Vital Signs BP (!) 152/101 (BP Location: Right Arm) Comment: repeat x2. pt reports hx of similar. states is trying to get in with pcp and psychiatrist.  Pulse 85   Temp 97.9 F (36.6 C) (Oral)   Resp 20   SpO2 96%   Visual Acuity Right Eye Distance:   Left Eye Distance:   Bilateral Distance:    Right Eye Near:   Left Eye Near:    Bilateral Near:     Physical Exam Vitals and nursing note reviewed.  Constitutional:      Appearance: Normal appearance.  HENT:     Head: Atraumatic.  Eyes:     Extraocular Movements: Extraocular movements intact.     Conjunctiva/sclera: Conjunctivae normal.  Cardiovascular:     Rate and Rhythm: Normal rate and regular rhythm.  Pulmonary:     Effort: Pulmonary effort is normal.     Breath sounds: Normal breath sounds.  Musculoskeletal:        General: Tenderness present. No swelling.  Normal range of motion.     Cervical back: Normal range of motion and neck supple.     Comments: Tenderness on palpation over the left lateral elbow and surrounding musculature  Skin:    General:  Skin is warm and dry.     Findings: No bruising or erythema.  Neurological:     General: No focal deficit present.     Mental Status: He is oriented to person, place, and time.     Motor: No weakness.     Gait: Gait normal.     Comments: Left upper extremity neurovascularly intact  Psychiatric:        Mood and Affect: Mood normal.        Thought Content: Thought content normal.        Judgment: Judgment normal.      UC Treatments / Results  Labs (all labs ordered are listed, but only abnormal results are displayed) Labs Reviewed - No data to display  EKG   Radiology No results found.  Procedures Procedures (including critical care time)  Medications Ordered in UC Medications - No data to display  Initial Impression / Assessment and Plan / UC Course  I have reviewed the triage vital signs and the nursing notes.  Pertinent labs & imaging results that were available during my care of the patient were reviewed by me and considered in my medical decision making (see chart for details).     Consistent with tendinitis, treat with short course of prednisone, Zanaflex, massage, compression, stretches and is much as able, revision of the repetitive motions at work.  Work note given for rest.  He states he is following up with his primary care for this later this week. Final Clinical Impressions(s) / UC Diagnoses   Final diagnoses:  Elbow tendonitis     Discharge Instructions      Continue the over-the-counter remedies that you are already doing in addition to the medications prescribed.  I have given you several days off of work to rest.  Follow-up with your primary care as scheduled for more ongoing management    ED Prescriptions     Medication Sig Dispense Auth. Provider    predniSONE (DELTASONE) 20 MG tablet Take 2 tablets (40 mg total) by mouth daily with breakfast. 10 tablet Particia Nearing, PA-C   tiZANidine (ZANAFLEX) 4 MG capsule Take 1 capsule (4 mg total) by mouth 3 (three) times daily as needed for muscle spasms. Do not drink alcohol or drive while taking this medication. May cause drowsiness 15 capsule Particia Nearing, [place]      PDMP not reviewed this encounter.   Particia Nearing, [place] 12/15/22 1114

## 2022-12-15 NOTE — ED Triage Notes (Addendum)
Pt reports intermittent left elbow pain x3 weeks. Has taken ibuprofen and icy hot with no change in pain. Pt denies any known injury but reports does repetitive motions at work. No obvious deformity noted.  pt reports had a few days off of work with recent virus and reports pain improved but returned once went back to work.

## 2022-12-15 NOTE — Discharge Instructions (Signed)
Continue the over-the-counter remedies that you are already doing in addition to the medications prescribed.  I have given you several days off of work to rest.  Follow-up with your primary care as scheduled for more ongoing management

## 2022-12-17 ENCOUNTER — Telehealth: Payer: Self-pay

## 2022-12-17 NOTE — Telephone Encounter (Signed)
Copied from CRM [id number]. Topic: General - Other >> Dec 17, 2022  1:58 PM Sasha H wrote: Reason for CRM: Pt just called to update that the muscle relaxer's prescribed to him at urgent care on 11/17 has been helping his Left elbow so far. Also has been wrapping it up and has a sling

## 2022-12-22 NOTE — Progress Notes (Unsigned)
   Established Patient Office Visit   Subjective  Patient ID: Nathaniel Medina, male    DOB: 30-Jan-1985  Age: 37 y.o. MRN: [id number]  No chief complaint on file.   He  has a past medical history of Hypertension and Manic depression (HCC).  HPI  ROS    Objective:     There were no vitals taken for this visit. {Vitals History (Optional):23777}  Physical Exam   No results found for any visits on 12/23/22.  The ASCVD Risk score (Arnett DK, et al., 2019) failed to calculate for the following reasons:   The 2019 ASCVD risk score is only valid for ages 13 to 74    Assessment & Plan:  There are no diagnoses linked to this encounter.  No follow-ups on file.   Cruzita Lederer Newman Nip, FNP

## 2022-12-23 ENCOUNTER — Encounter: Payer: Self-pay | Admitting: Family Medicine

## 2022-12-24 NOTE — Progress Notes (Signed)
NO SHOW

## 2023-01-02 ENCOUNTER — Encounter: Payer: Self-pay | Admitting: Internal Medicine

## 2023-01-13 ENCOUNTER — Ambulatory Visit
Admission: RE | Admit: 2023-01-13 | Discharge: 2023-01-13 | Disposition: A | Discharge status: Home / Self Care | Payer: Commercial Managed Care - PPO | Source: Ambulatory Visit | Attending: Nurse Practitioner | Admitting: Nurse Practitioner

## 2023-01-13 ENCOUNTER — Ambulatory Visit: Payer: Commercial Managed Care - PPO

## 2023-01-13 VITALS — BP 151/97 | HR 106 | Temp 98.5°F | Resp 18

## 2023-01-13 DIAGNOSIS — M778 Other enthesopathies, not elsewhere classified: Secondary | ICD-10-CM | POA: Diagnosis not present

## 2023-01-13 MED ORDER — PREDNISONE 20 MG PO TABS: 40.0000 mg | ORAL_TABLET | Freq: Every day | ORAL | 0 refills | Status: AC

## 2023-01-13 MED ORDER — PREDNISONE 20 MG PO TABS: 40.0000 mg | ORAL_TABLET | Freq: Every day | ORAL | 0 refills | Status: DC

## 2023-01-13 NOTE — ED Triage Notes (Signed)
Pt states he is having left elbow pain that started almost 2 months ago. Taking tylenol and ibuprofen with no relief of pain. Pt denies any injury and has decreased his work load and heavy lifting and wearing arm brace with no help/ change to pain.

## 2023-01-13 NOTE — ED Provider Notes (Signed)
[facility]    CSN: [id number] Arrival date & time: 01/13/23  1338      History   Chief Complaint Chief Complaint  Patient presents with   Arm Pain    HPI Nathaniel Medina is a 37 y.o. male.   The history is provided by the patient.   Patient presenting today with pain and stiffness to the left elbow worse with movement for the past 2 months. States he does a lot of repetitive heavy lifting at work and thinks this may have caused his symptoms. Patient was seen in this clinic on 12/15/22 and was prescribed prednisone and tizanidine.  Patient states that symptoms initially improved with this treatment but have since worsened.  He states the pain in the left elbow is now constant, describes the pain as "a dull ache."  He states that he has had modifications in his daily job responsibilities and has started wearing an arm brace with minimal relief.  Patient states that he is right-hand dominant.    Past Medical History:  Diagnosis Date   Hypertension    Manic depression [facility])     Patient Active Problem List   Diagnosis Date Noted   Increased frequency of headaches 12/07/2021   ADHD 11/26/2021   Encounter for well adult exam with abnormal findings 11/26/2021   Refused influenza vaccine 10/19/2021   Hypertension 10/19/2021   Anxiety and depression 10/19/2021   Sinobronchitis 10/19/2021    History reviewed. No pertinent surgical history.     Home Medications    Prior to Admission medications   Medication Sig Start Date End Date Taking? Authorizing Provider  ASHWAGANDHA PO Take by mouth. Once daily   Yes [provider]  MAGNESIUM OXIDE PO Take by mouth. Once daily   Yes [provider]  Multiple Vitamins-Minerals (ZINC PO) Take by mouth. Once daily   Yes [provider]  predniSONE (DELTASONE) 20 MG tablet Take 2 tablets (40 mg total) by mouth daily with breakfast for 5 days. 01/13/23 01/18/23 Yes Leath-Warren, Sadie Haber, NP  TURMERIC  PO Take by mouth. Once daily   Yes [provider]  albuterol (VENTOLIN HFA) 108 (90 Base) MCG/ACT inhaler Inhale 1-2 puffs into the lungs every 6 (six) hours as needed for wheezing or shortness of breath. 10/07/21   Raspet, Erin K, PA-C  amLODipine-olmesartan (AZOR) 5-20 MG tablet Take 2 tablets by mouth daily. 02/05/22 05/06/22  Billie Lade, MD  Ascorbic Acid (VITAMIN C PO) Take by mouth. As needed    [provider]  benzonatate (TESSALON) 100 MG capsule Take 1 capsule (100 mg total) by mouth 3 (three) times daily as needed for cough. Do not take with alcohol or while driving or operating heavy machinery.  May cause drowsiness. 12/02/22   Valentino Nose, NP  Black Elderberry (SAMBUCUS ELDERBERRY PO) Take 1 tablet by mouth daily. Patient not taking: Reported on 12/15/2022    [provider]  Cyanocobalamin (VITAMIN B-12) 2500 MCG SUBL Place 1 tablet under the tongue daily.    [provider]  escitalopram (LEXAPRO) 20 MG tablet Take 1 tablet (20 mg total) by mouth daily. 02/05/22 05/06/22  Billie Lade, MD  Ferrous Sulfate (IRON PO) Take by mouth. Once daily    [provider]  Ginsengs-Saw [facility] GINSENG & SAW PALMETTO) 500 MG CAPS Take 1 capsule by mouth daily.    [provider]  ibuprofen (ADVIL) 200 MG tablet Take 800 mg by mouth every 6 (six)  hours as needed.    [provider]  [place] Ginseng 100 MG CAPS Take 1 capsule by mouth daily.    [provider]  Misc Natural Products (ENERGY SUPPORT PO) Take 1 tablet by mouth as needed.    [provider]  tiZANidine (ZANAFLEX) 4 MG capsule Take 1 capsule (4 mg total) by mouth 3 (three) times daily as needed for muscle spasms. Do not drink alcohol or drive while taking this medication.  May cause drowsiness. 09/22/22   Particia Nearing, PA-C  tiZANidine (ZANAFLEX) 4 MG capsule Take 1 capsule (4 mg total) by mouth 3 (three) times daily as needed for  muscle spasms. Do not drink alcohol or drive while taking this medication. May cause drowsiness 12/15/22   Particia Nearing, PA-C  UNABLE TO FIND Large Blood Pressure Cuff DX: I10 10/19/21   Paseda, Baird Kay, FNP    Family History Family History  Problem Relation Age of Onset   Breast cancer Mother     Social History Social History   Tobacco Use   Smoking status: Former    Types: Cigars    Quit date: 2020    Years since quitting: 4.9   Smokeless tobacco: Never   Tobacco comments:    6 cigars/day  Vaping Use   Vaping status: Never Used  Substance Use Topics   Alcohol use: Not Currently    Comment: occasionally   Drug use: Never     Allergies   Penicillins   Review of Systems Review of Systems Per HPI  Physical Exam Triage Vital Signs ED Triage Vitals  Encounter Vitals Group     BP 01/13/23 1356 (!) 165/102     Systolic BP Percentile --      Diastolic BP Percentile --      Pulse Rate 01/13/23 1356 (!) 106     Resp 01/13/23 1356 18     Temp 01/13/23 1356 98.5 F (36.9 C)     Temp Source 01/13/23 1356 Oral     SpO2 01/13/23 1356 97 %     Weight --      Height --      Head Circumference --      Peak Flow --      Pain Score 01/13/23 1357 8     Pain Loc --      Pain Education --      Exclude from Growth Chart --    No data found.  Updated Vital Signs BP (!) 151/97 (BP Location: Right Arm)   Pulse (!) 106   Temp 98.5 F (36.9 C) (Oral)   Resp 18   SpO2 97%   Visual Acuity Right Eye Distance:   Left Eye Distance:   Bilateral Distance:    Right Eye Near:   Left Eye Near:    Bilateral Near:     Physical Exam Vitals and nursing note reviewed.  Constitutional:      General: He is not in acute distress.    Appearance: Normal appearance.  HENT:     Head: Normocephalic.  Eyes:     Extraocular Movements: Extraocular movements intact.     Pupils: Pupils are equal, round, and reactive to light.  Pulmonary:     Effort: Pulmonary effort is  normal.  Musculoskeletal:     Left upper arm: Normal.     Left elbow: No swelling. Normal range of motion. Tenderness (Tenderness noted to the lateral epicondyle region extending down into the left forearm) present.  Left forearm: Normal.     Left wrist: Normal.     Left hand: Decreased strength (decreased grip strength). Normal capillary refill. Normal pulse.     Cervical back: Normal range of motion.  Skin:    General: Skin is warm and dry.  Neurological:     General: No focal deficit present.     Mental Status: He is alert and oriented to person, place, and time.  Psychiatric:        Mood and Affect: Mood normal.        Behavior: Behavior normal.      UC Treatments / Results  Labs (all labs ordered are listed, but only abnormal results are displayed) Labs Reviewed - No data to display  EKG   Radiology No results found.  Procedures Procedures (including critical care time)  Medications Ordered in UC Medications - No data to display  Initial Impression / Assessment and Plan / UC Course  I have reviewed the triage vital signs and the nursing notes.  Pertinent labs & imaging results that were available during my care of the patient were reviewed by me and considered in my medical decision making (see chart for details).  X-ray of the left elbow is pending.  Symptoms remain consistent with left elbow tendinitis.  Prednisone 40 mg for the next 5 days.  Supportive care recommendations were provided and discussed with the patient to include continuing use of the elbow wrap, Tylenol for breakthrough pain or discomfort, and gentle range of motion exercises.  Patient states that he plans to follow-up with his employer to pursue Worker's Comp.  Patient was in agreement with this plan of care and verbalized understanding.  All questions were answered.  Patient stable for discharge.  Work note was provided.  Final Clinical Impressions(s) / UC Diagnoses   Final diagnoses:  Left  elbow tendonitis     Discharge Instructions      X-ray of the left elbow is pending.  You will be contacted if the pending test result is abnormal.  You also have access to results via MyChart. Take medication as prescribed. May take over-the-counter Tylenol for breakthrough pain or discomfort while taking the prednisone. RICE therapy rest, ice, compression, and elevation until your symptoms improve.  Apply ice for 20 minutes, remove for 1 hour, then repeat as often as possible.  This will help with pain and swelling. Continue wearing your elbow wrap when you are engaged in strenuous, repetitive, or prolonged activities. Follow-up as needed.     ED Prescriptions     Medication Sig Dispense Auth. Provider   predniSONE (DELTASONE) 20 MG tablet Take 2 tablets (40 mg total) by mouth daily with breakfast for 5 days. 10 tablet Leath-Warren, Sadie Haber, NP      PDMP not reviewed this encounter.   Abran Cantor, NP 01/13/23 1439

## 2023-01-13 NOTE — Discharge Instructions (Signed)
X-ray of the left elbow is pending.  You will be contacted if the pending test result is abnormal.  You also have access to results via MyChart. Take medication as prescribed. May take over-the-counter Tylenol for breakthrough pain or discomfort while taking the prednisone. RICE therapy rest, ice, compression, and elevation until your symptoms improve.  Apply ice for 20 minutes, remove for 1 hour, then repeat as often as possible.  This will help with pain and swelling. Continue wearing your elbow wrap when you are engaged in strenuous, repetitive, or prolonged activities. Follow-up as needed.

## 2023-01-30 ENCOUNTER — Ambulatory Visit (INDEPENDENT_AMBULATORY_CARE_PROVIDER_SITE_OTHER): Payer: Self-pay | Admitting: Internal Medicine

## 2023-01-30 ENCOUNTER — Encounter: Payer: Self-pay | Admitting: Internal Medicine

## 2023-01-30 VITALS — BP 156/92 | HR 101 | Ht 71.0 in | Wt 193.0 lb

## 2023-01-30 DIAGNOSIS — M7712 Lateral epicondylitis, left elbow: Secondary | ICD-10-CM

## 2023-01-30 DIAGNOSIS — I10 Essential (primary) hypertension: Secondary | ICD-10-CM

## 2023-01-30 MED ORDER — MELOXICAM 7.5 MG PO TABS: 7.5000 mg | ORAL_TABLET | Freq: Every day | ORAL | 0 refills | Status: DC

## 2023-01-30 NOTE — Patient Instructions (Signed)
 It was a pleasure to see you today.  Thank you for giving us  the opportunity to be involved in your care.  Below is a brief recap of your visit and next steps.  We will plan to see you again in 6 weeks - 3 months   Summary You history and exam findings today seem most consistent with tennis elbow Take meloxicam  daily as prescribed and I recommend purchasing a  tennis elbow strap Orthopedic surgery referral placed Follow up in 6 weeks - 3 months for routine care

## 2023-01-30 NOTE — Progress Notes (Signed)
   Acute Office Visit  Subjective:     Patient ID: Nathaniel Medina, male    DOB: [birth year], 38 y.o.   MRN: [id number]  Chief Complaint  Patient presents with   Arm Pain    Patient complains of L arm elbow pain starting two months ago. Has been seen at [facility] but not ortho.    [name] presents today for an acute visit endorsing a 68-month history of intermittent left lateral elbow pain.  He has multiple urgent care presentations over the last 2 months, 1216/24 most recently, diagnosed with a left elbow tendinitis and treated with prednisone .  Left forearm x-rays were negative for acute findings.  Pain is worse with repetitive motions.  He works third shift moving light weight boxes.  He has tried taking prednisone  and muscle relaxers for pain without sustained relief.  Review of Systems  Musculoskeletal:  Positive for joint pain (Left elbow).  All other systems reviewed and are negative.     Objective:    BP (!) 156/92   Pulse (!) 101   Ht 5' 11 (1.803 m)   Wt 193 lb (87.5 kg)   SpO2 95%   BMI 26.92 kg/m    Physical Exam Musculoskeletal:     Comments: No obvious deformity on inspection of the left elbow.  There is tenderness palpation over the left lateral elbow and forearm extensors.  ROM generally intact.  No pain is elicited with valgus or varus application at the left elbow.  Strength and sensation are intact.  Pain is elicited with passive flexion and resisted extension of the left forearm       Assessment & Plan:   Problem List Items Addressed This Visit       Left lateral epicondylitis - Primary   Presenting today for an acute visit endorsing a 35-month history of left lateral elbow pain.  He has been seen at urgent care on multiple occasions, diagnosed with left elbow tendinitis, and treated with steroids/muscle relaxers.  Pain is worsened with activity.  On exam he endorses tenderness to palpation over the left lateral epicondyle and forearm extensors.  Pain is elicited  with passive flexion and resisted extension of the left forearm.  His history and exam findings today seem most consistent with left lateral epicondylitis. -Treatment options reviewed.  Meloxicam  7.5 mg daily x 14 days prescribed.  I recommended purchasing a counterforce brace at his pharmacy.  Home PT exercises provided.  At the request of the patient and his employer, I have placed a referral to orthopedic surgery for further evaluation.  He will return to care in 6 weeks - 3 months for routine follow-up.      Meds ordered this encounter  Medications   meloxicam  (MOBIC ) 7.5 MG tablet    Sig: Take 1 tablet (7.5 mg total) by mouth daily for 14 days.    Dispense:  14 tablet    Refill:  0    Return in about 6 weeks (around 03/13/2023).  Nathaniel FORBES Fireman, MD

## 2023-01-30 NOTE — Assessment & Plan Note (Signed)
 Presenting today for an acute visit endorsing a 9-month history of left lateral elbow pain.  He has been seen at urgent care on multiple occasions, diagnosed with left elbow tendinitis, and treated with steroids/muscle relaxers.  Pain is worsened with activity.  On exam he endorses tenderness to palpation over the left lateral epicondyle and forearm extensors.  Pain is elicited with passive flexion and resisted extension of the left forearm.  His history and exam findings today seem most consistent with left lateral epicondylitis. -Treatment options reviewed.  Meloxicam  7.5 mg daily x 14 days prescribed.  I recommended purchasing a counterforce brace at his pharmacy.  Home PT exercises provided.  At the request of the patient and his employer, I have placed a referral to orthopedic surgery for further evaluation.  He will return to care in 6 weeks - 3 months for routine follow-up.

## 2023-02-05 ENCOUNTER — Encounter: Payer: Self-pay | Admitting: Orthopaedic Surgery

## 2023-02-05 ENCOUNTER — Ambulatory Visit: Payer: Commercial Managed Care - PPO | Admitting: Orthopaedic Surgery

## 2023-02-05 VITALS — BP 140/89 | HR 80 | Ht 71.0 in | Wt 190.0 lb

## 2023-02-05 DIAGNOSIS — M7712 Lateral epicondylitis, left elbow: Secondary | ICD-10-CM | POA: Diagnosis not present

## 2023-02-05 MED ORDER — MELOXICAM 7.5 MG PO TABS: ORAL_TABLET | ORAL | 2 refills | Status: DC

## 2023-02-05 MED ORDER — PREDNISONE 5 MG (21) PO TBPK
ORAL_TABLET | ORAL | 0 refills | Status: AC
Start: 2023-02-05 — End: ?

## 2023-02-05 NOTE — Progress Notes (Signed)
 Subjective:    Patient ID: Nathaniel Medina, male    DOB: [birth year], 38 y.o.   MRN: [id number]  HPI He has had pain in the left elbow for several weeks now, beginning in late November.  It hurts laterally.  It is affecting his job.  He has been seen by his primary care, Dr. Melvenia for this.  He has taken mobic  and it helps but his pain continues.  He is tired of it bothering him at work.  He is out of work until January 13.  He has no trauma, no numbness.   Review of Systems  Constitutional:  Positive for activity change.  Musculoskeletal:  Positive for arthralgias and myalgias.  All other systems reviewed and are negative. For Review of Systems, all other systems reviewed and are negative.  The following is a summary of the past history medically, past history surgically, known current medicines, social history and family history.  This information is gathered electronically by the computer from prior information and documentation.  I review this each visit and have found including this information at this point in the chart is beneficial and informative.   Past Medical History:  Diagnosis Date   Hypertension    Manic depression (HCC)     History reviewed. No pertinent surgical history.  Current Outpatient Medications on File Prior to Visit  Medication Sig Dispense Refill   Ascorbic Acid (VITAMIN C PO) Take by mouth. As needed     ASHWAGANDHA PO Take by mouth. Once daily     Black Elderberry (SAMBUCUS ELDERBERRY PO) Take 1 tablet by mouth daily.     Cyanocobalamin (VITAMIN B-12) 2500 MCG SUBL Place 1 tablet under the tongue daily.     Ferrous Sulfate (IRON PO) Take by mouth. Once daily     Ginsengs-Saw Palmetto (MULTI GINSENG & SAW PALMETTO) 500 MG CAPS Take 1 capsule by mouth daily.     ibuprofen (ADVIL) 200 MG tablet Take 800 mg by mouth every 6 (six) hours as needed.     [place] Ginseng 100 MG CAPS Take 1 capsule by mouth daily.     MAGNESIUM OXIDE PO Take by mouth. Once daily      Misc Natural Products (ENERGY SUPPORT PO) Take 1 tablet by mouth as needed.     Multiple Vitamins-Minerals (ZINC PO) Take by mouth. Once daily     TURMERIC PO Take by mouth. Once daily     UNABLE TO FIND Large Blood Pressure Cuff DX: I10 1 each 0   amLODipine -olmesartan  (AZOR ) 5-20 MG tablet Take 2 tablets by mouth daily. 60 tablet 2   escitalopram  (LEXAPRO ) 20 MG tablet Take 1 tablet (20 mg total) by mouth daily. 30 tablet 2   No current facility-administered medications on file prior to visit.    Social History   Socioeconomic History   Marital status: Single    Spouse name: Not on file   Number of children: 2   Years of education: Not on file   Highest education level: Not on file  Occupational History   Not on file  Tobacco Use   Smoking status: Former    Types: Cigars    Quit date: 2020    Years since quitting: 5.0   Smokeless tobacco: Never   Tobacco comments:    6 cigars/day  Vaping Use   Vaping status: Never Used  Substance and Sexual Activity   Alcohol use: Not Currently    Comment: occasionally   Drug use: Never  Sexual activity: Yes  Other Topics Concern   Not on file  Social History Narrative   Live with his parents .    Social Drivers of Corporate Investment Banker Strain: Not on file  Food Insecurity: Not on file  Transportation Needs: Not on file  Physical Activity: Not on file  Stress: Not on file  Social Connections: Not on file  Intimate Partner Violence: Not on file    Family History  Problem Relation Age of Onset   Breast cancer Mother     BP (!) 140/89   Pulse 80   Ht 5' 11 (1.803 m)   Wt 190 lb (86.2 kg)   BMI 26.50 kg/m   Body mass index is 26.5 kg/m.      Objective:   Physical Exam Vitals and nursing note reviewed. Exam conducted with a chaperone present.  Constitutional:      Appearance: He is well-developed.  HENT:     Head: Normocephalic and atraumatic.  Eyes:     Conjunctiva/sclera: Conjunctivae normal.      Pupils: Pupils are equal, round, and reactive to light.  Cardiovascular:     Rate and Rhythm: Normal rate and regular rhythm.  Pulmonary:     Effort: Pulmonary effort is normal.  Abdominal:     Palpations: Abdomen is soft.  Musculoskeletal:       Arms:     Cervical back: Normal range of motion and neck supple.  Skin:    General: Skin is warm and dry.  Neurological:     Mental Status: He is alert and oriented to person, place, and time.     Cranial Nerves: No cranial nerve deficit.     Motor: No abnormal muscle tone.     Coordination: Coordination normal.     Deep Tendon Reflexes: Reflexes are normal and symmetric. Reflexes normal.  Psychiatric:        Behavior: Behavior normal.        Thought Content: Thought content normal.        Judgment: Judgment normal.     I have independently reviewed and interpreted x-rays of this patient done at another site by another physician or qualified health professional.  I have read Dr. Cleo notes.      Assessment & Plan:   Encounter Diagnosis  Name Primary?   Left lateral epicondylitis Yes   I have explained what tennis elbow is and that it takes time to resolve.  I have explained ice massage.  I will give prednisone  dose pack to use and hold the Mobic  while taking.  I will given new Rx for the Mobic .  Return in two weeks.  He has a tennis elbow band and he is to use this.  Call if any problem.  Precautions discussed.  Electronically Signed Lemond Stable, MD 1/8/202511:54 AM

## 2023-02-06 ENCOUNTER — Encounter: Payer: Self-pay | Admitting: Internal Medicine

## 2023-02-11 ENCOUNTER — Telehealth: Payer: Self-pay | Admitting: Orthopaedic Surgery

## 2023-02-11 ENCOUNTER — Other Ambulatory Visit: Payer: Self-pay | Admitting: Orthopaedic Surgery

## 2023-02-11 MED ORDER — MELOXICAM 15 MG PO TABS: 15.0000 mg | ORAL_TABLET | Freq: Every day | ORAL | 5 refills | Status: DC

## 2023-02-11 NOTE — Telephone Encounter (Signed)
 Dr. Sanjuan Dame pt - spoke w/Aaron w/[facility] [phone], he stated that the patient's insurance will only let him have the Meloxicam one tablet a day.  Clifton Custard suggested it be prescribed 7.5 once a day or increase to 15mg  to once a day.

## 2023-02-11 NOTE — Telephone Encounter (Signed)
 Copied from CRM [id number]. Topic: Clinical - Prescription Issue >> Feb 11, 2023 11:39 AM Ivette P wrote: Reason for CRM: Pt called in about meloxicam  (MOBIC ) 7.5 MG tablet, insurance does not want to approve dosage, insurance only approves 15 grams. Requesting callback [phone].

## 2023-02-12 ENCOUNTER — Telehealth: Payer: Self-pay | Admitting: Internal Medicine

## 2023-02-12 ENCOUNTER — Other Ambulatory Visit: Payer: Self-pay | Admitting: Orthopaedic Surgery

## 2023-02-12 ENCOUNTER — Telehealth: Payer: Self-pay | Admitting: Orthopaedic Surgery

## 2023-02-12 NOTE — Addendum Note (Signed)
 Addended byArla Lab on: 02/12/2023 03:04 PM   Modules accepted: Orders

## 2023-02-12 NOTE — Telephone Encounter (Signed)
 LAB ORDER,  Dr Iline Mallory asked if he got lab order, I dont see in system, he said her ordered a lead screen, do you know if he got lab order if so where is he going?

## 2023-02-12 NOTE — Telephone Encounter (Signed)
 This is not the patient Dr. Linnell Richardson requested a lead test on, I don't know anything about labs for this patient.

## 2023-02-12 NOTE — Telephone Encounter (Signed)
Error,thanks

## 2023-02-12 NOTE — Telephone Encounter (Signed)
 FMLA  Noted  Copied Sleeved  Original in PCP box Copy front desk folder

## 2023-02-13 NOTE — Telephone Encounter (Signed)
Gave the form to Nathaniel Medina to send to [address]

## 2023-02-19 ENCOUNTER — Ambulatory Visit: Payer: BC Managed Care – PPO | Admitting: Orthopaedic Surgery

## 2023-02-21 NOTE — Telephone Encounter (Signed)
Called left voicemail to return call to schedule for a video call to discuss FMLA and disability forms

## 2023-06-26 NOTE — Telephone Encounter (Signed)
Called patient left voicemail to call office to schedule an appointment 

## 2023-09-19 ENCOUNTER — Encounter: Payer: Self-pay | Admitting: Radiology

## 2023-10-03 ENCOUNTER — Ambulatory Visit
Admission: RE | Admit: 2023-10-03 | Discharge: 2023-10-03 | Disposition: A | Discharge status: Home / Self Care | Source: Ambulatory Visit | Attending: Family Medicine | Admitting: Family Medicine

## 2023-10-03 VITALS — BP 142/91 | HR 85 | Temp 98.0°F | Resp 20

## 2023-10-03 DIAGNOSIS — S39012A Strain of muscle, fascia and tendon of lower back, initial encounter: Secondary | ICD-10-CM

## 2023-10-03 MED ORDER — NAPROXEN 500 MG PO TABS: 500.0000 mg | ORAL_TABLET | Freq: Two times a day (BID) | ORAL | 0 refills | Status: AC | PRN

## 2023-10-03 MED ORDER — TIZANIDINE HCL 4 MG PO CAPS: 4.0000 mg | ORAL_CAPSULE | Freq: Three times a day (TID) | ORAL | 0 refills | Status: AC | PRN

## 2023-10-03 NOTE — ED Provider Notes (Signed)
 [facility]    CSN: [id number] Arrival date & time: 10/03/23  0845      History   Chief Complaint Chief Complaint  Patient presents with   Back Pain    Lower left side of back sharp pain on and off. Been going on for approximately 2 months - Entered by patient    HPI Nathaniel Medina is a 38 y.o. male.   Patient presenting today with 11-month history of waxing and waning left sided low back pain that he states feels like a muscular soreness and pulling sensation with certain movements.  He states he lifts heavy boxes all day for work and thinks this is making it worse.  He denies radiation of pain down legs, numbness, tingling, weakness, bowel or bladder incontinence, saddle anesthesias, loss of range of motion.  So far trying creams and ibuprofen with minimal relief.  Has switched from exertional lifting to body weight exercises and stretches with minimal relief.    Past Medical History:  Diagnosis Date   Hypertension    Manic depression [facility])     Patient Active Problem List   Diagnosis Date Noted   Left lateral epicondylitis 01/30/2023   Increased frequency of headaches 12/07/2021   ADHD 11/26/2021   Encounter for well adult exam with abnormal findings 11/26/2021   Refused influenza vaccine 10/19/2021   Hypertension 10/19/2021   Anxiety and depression 10/19/2021   Sinobronchitis 10/19/2021   Right shoulder strain 01/11/2019   Rotator cuff tendinitis, right 01/11/2019    History reviewed. No pertinent surgical history.     Home Medications    Prior to Admission medications   Medication Sig Start Date End Date Taking? Authorizing Provider  naproxen  (NAPROSYN ) 500 MG tablet Take 1 tablet (500 mg total) by mouth 2 (two) times daily as needed. 10/03/23  Yes Stuart Vernell Norris, PA-C  tiZANidine  (ZANAFLEX ) 4 MG capsule Take 1 capsule (4 mg total) by mouth 3 (three) times daily as needed for muscle spasms. Do not drink alcohol or drive while taking this  medication.  May cause drowsiness. 10/03/23  Yes Stuart Vernell Norris, PA-C  amLODipine -olmesartan  (AZOR ) 5-20 MG tablet Take 2 tablets by mouth daily. 02/05/22 05/06/22  Dixon, Phillip E, MD  Ascorbic Acid (VITAMIN C PO) Take by mouth. As needed    [provider]  ASHWAGANDHA PO Take by mouth. Once daily    [provider]  Black Elderberry (SAMBUCUS ELDERBERRY PO) Take 1 tablet by mouth daily.    [provider]  Cyanocobalamin (VITAMIN B-12) 2500 MCG SUBL Place 1 tablet under the tongue daily.    [provider]  escitalopram  (LEXAPRO ) 20 MG tablet Take 1 tablet (20 mg total) by mouth daily. 02/05/22 05/06/22  Dixon, Phillip E, MD  Ferrous Sulfate (IRON PO) Take by mouth. Once daily    [provider]  Ginsengs-Saw Palmetto [facility] GINSENG & SAW PALMETTO) 500 MG CAPS Take 1 capsule by mouth daily.    [provider]  ibuprofen (ADVIL) 200 MG tablet Take 800 mg by mouth every 6 (six) hours as needed.    [provider]  [place] Ginseng 100 MG CAPS Take 1 capsule by mouth daily.    [provider]  MAGNESIUM OXIDE PO Take by mouth. Once daily    [provider]  Misc Natural Products (ENERGY SUPPORT PO) Take 1 tablet by mouth as needed.    [provider]  Multiple Vitamins-Minerals (ZINC PO) Take by mouth. Once daily  [provider]  predniSONE  (STERAPRED UNI-PAK 21 TAB) 5 MG (21) TBPK tablet Take 6 pills first day; 5 pills second day; 4 pills third day; 3 pills fourth day; 2 pills next day and 1 pill last day. 02/05/23   Brenna Lin, MD  TURMERIC PO Take by mouth. Once daily    [provider]  UNABLE TO FIND Large Blood Pressure Cuff DX: I10 10/19/21   Paseda, Folashade R, FNP    Family History Family History  Problem Relation Age of Onset   Breast cancer Mother     Social History Social History   Tobacco Use   Smoking status: Former    Types: Cigars    Quit date: 2020    Years  since quitting: 5.6   Smokeless tobacco: Never   Tobacco comments:    6 cigars/day  Vaping Use   Vaping status: Never Used  Substance Use Topics   Alcohol use: Not Currently    Comment: occasionally   Drug use: Never     Allergies   Penicillins   Review of Systems Review of Systems PER HPI  Physical Exam Triage Vital Signs ED Triage Vitals  Encounter Vitals Group     BP 10/03/23 0853 (!) 142/91     Girls Systolic BP Percentile --      Girls Diastolic BP Percentile --      Boys Systolic BP Percentile --      Boys Diastolic BP Percentile --      Pulse Rate 10/03/23 0853 85     Resp 10/03/23 0853 20     Temp 10/03/23 0853 98 F (36.7 C)     Temp Source 10/03/23 0853 Oral     SpO2 10/03/23 0853 95 %     Weight --      Height --      Head Circumference --      Peak Flow --      Pain Score 10/03/23 0851 5     Pain Loc --      Pain Education --      Exclude from Growth Chart --    No data found.  Updated Vital Signs BP (!) 142/91 (BP Location: Right Arm)   Pulse 85   Temp 98 F (36.7 C) (Oral)   Resp 20   SpO2 95%   Visual Acuity Right Eye Distance:   Left Eye Distance:   Bilateral Distance:    Right Eye Near:   Left Eye Near:    Bilateral Near:     Physical Exam Vitals and nursing note reviewed.  Constitutional:      Appearance: Normal appearance.  HENT:     Head: Atraumatic.  Eyes:     Extraocular Movements: Extraocular movements intact.     Conjunctiva/sclera: Conjunctivae normal.  Cardiovascular:     Rate and Rhythm: Normal rate.  Pulmonary:     Effort: Pulmonary effort is normal.  Musculoskeletal:        General: Normal range of motion.     Cervical back: Normal range of motion and neck supple.     Comments: No midline spinal tenderness to palpation diffusely.  Negative straight leg raise bilateral lower extremities.  Normal gait and range of motion.  No significant localized tenderness to palpation to bilateral lumbar musculature but  patient points to the lateral lumbar musculature on the left for the area of his pain  Skin:    General: Skin is warm and dry.  Neurological:  Mental Status: He is oriented to person, place, and time.     Comments: Bilateral lower extremities neurovascularly intact  Psychiatric:        Mood and Affect: Mood normal.        Thought Content: Thought content normal.        Judgment: Judgment normal.      UC Treatments / Results  Labs (all labs ordered are listed, but only abnormal results are displayed) Labs Reviewed - No data to display  EKG   Radiology No results found.  Procedures Procedures (including critical care time)  Medications Ordered in UC Medications - No data to display  Initial Impression / Assessment and Plan / UC Course  I have reviewed the triage vital signs and the nursing notes.  Pertinent labs & imaging results that were available during my care of the patient were reviewed by me and considered in my medical decision making (see chart for details).     Suspect lumbar strain, treat with Zanaflex , naproxen , heat, massage, stretches, rest.  Work note given for rest.  Return for worsening symptoms.  Final Clinical Impressions(s) / UC Diagnoses   Final diagnoses:  Strain of lumbar region, initial encounter   Discharge Instructions   None    ED Prescriptions     Medication Sig Dispense Auth. Provider   tiZANidine  (ZANAFLEX ) 4 MG capsule Take 1 capsule (4 mg total) by mouth 3 (three) times daily as needed for muscle spasms. Do not drink alcohol or drive while taking this medication.  May cause drowsiness. 15 capsule Stuart Vernell Norris, PA-C   naproxen  (NAPROSYN ) 500 MG tablet Take 1 tablet (500 mg total) by mouth 2 (two) times daily as needed. 20 tablet Stuart Vernell Norris, [place]      PDMP not reviewed this encounter.   Stuart Vernell [place], [place] 10/03/23 [phone]

## 2023-10-03 NOTE — ED Triage Notes (Signed)
 Reports low left back pain x 2 months  Applied creams and ibuprofen but no relief

## 2023-12-01 ENCOUNTER — Encounter: Payer: Self-pay | Admitting: Radiology

## 2024-01-26 ENCOUNTER — Ambulatory Visit: Payer: Self-pay | Admitting: Internal Medicine
